# Patient Record
Sex: Female | Born: 1975 | Race: White | Hispanic: No | Marital: Married | Smoking: Never smoker
Health system: Southern US, Community
[De-identification: ages and names within clinical notes are randomized; demographics above are authoritative.]

## PROBLEM LIST (undated history)

## (undated) DIAGNOSIS — J45909 Unspecified asthma, uncomplicated: Secondary | ICD-10-CM

## (undated) DIAGNOSIS — F32A Depression, unspecified: Secondary | ICD-10-CM

## (undated) DIAGNOSIS — F329 Major depressive disorder, single episode, unspecified: Secondary | ICD-10-CM

## (undated) DIAGNOSIS — E039 Hypothyroidism, unspecified: Secondary | ICD-10-CM

---
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
## 2015-04-02 LAB — BASIC METABOLIC PANEL
BUN: 9 (ref 4–21)
CREATININE: 0.7 (ref 0.5–1.1)
Glucose: 128
POTASSIUM: 3.5 (ref 3.4–5.3)
Sodium: 137 (ref 137–147)

## 2015-04-02 LAB — CBC AND DIFFERENTIAL
HEMATOCRIT: 38 (ref 36–46)
HEMOGLOBIN: 12.8 (ref 12.0–16.0)
Platelets: 223 (ref 150–399)
WBC: 5.7

## 2015-04-02 LAB — HEPATIC FUNCTION PANEL
ALT: 18 (ref 7–35)
AST: 25 (ref 13–35)
Alkaline Phosphatase: 41 (ref 25–125)
Bilirubin, Total: 0.4

## 2015-10-31 LAB — TSH: TSH: 1.5 (ref <=5.90)

## 2015-11-02 LAB — HM MAMMOGRAPHY

## 2015-11-07 LAB — HM PAP SMEAR: HM PAP: NEGATIVE

## 2017-01-30 ENCOUNTER — Ambulatory Visit: Payer: Self-pay | Admitting: Family Medicine

## 2017-02-26 ENCOUNTER — Ambulatory Visit (INDEPENDENT_AMBULATORY_CARE_PROVIDER_SITE_OTHER): Admitting: Physician Assistant

## 2017-02-26 ENCOUNTER — Encounter: Payer: Self-pay | Admitting: Physician Assistant

## 2017-02-26 VITALS — BP 112/68 | HR 71 | Temp 98.3°F | Ht 63.0 in | Wt 122.0 lb

## 2017-02-26 DIAGNOSIS — E039 Hypothyroidism, unspecified: Secondary | ICD-10-CM

## 2017-02-26 DIAGNOSIS — Z8659 Personal history of other mental and behavioral disorders: Secondary | ICD-10-CM

## 2017-02-26 DIAGNOSIS — F329 Major depressive disorder, single episode, unspecified: Secondary | ICD-10-CM

## 2017-02-26 DIAGNOSIS — Z130 Encounter for screening for diseases of the blood and blood-forming organs and certain disorders involving the immune mechanism: Secondary | ICD-10-CM

## 2017-02-26 DIAGNOSIS — Z23 Encounter for immunization: Secondary | ICD-10-CM

## 2017-02-26 DIAGNOSIS — J45990 Exercise induced bronchospasm: Secondary | ICD-10-CM | POA: Insufficient documentation

## 2017-02-26 DIAGNOSIS — Z131 Encounter for screening for diabetes mellitus: Secondary | ICD-10-CM

## 2017-02-26 DIAGNOSIS — Z1239 Encounter for other screening for malignant neoplasm of breast: Secondary | ICD-10-CM

## 2017-02-26 DIAGNOSIS — F32A Depression, unspecified: Secondary | ICD-10-CM

## 2017-02-26 DIAGNOSIS — Z1231 Encounter for screening mammogram for malignant neoplasm of breast: Secondary | ICD-10-CM

## 2017-02-26 DIAGNOSIS — Z3041 Encounter for surveillance of contraceptive pills: Secondary | ICD-10-CM

## 2017-02-26 DIAGNOSIS — Z13220 Encounter for screening for lipoid disorders: Secondary | ICD-10-CM

## 2017-02-26 MED ORDER — DROSPIRENONE-ETHINYL ESTRADIOL 3-0.02 MG PO TABS: 1.0000 | ORAL_TABLET | Freq: Every day | ORAL | 4 refills | Status: DC

## 2017-02-26 MED ORDER — ALBUTEROL SULFATE HFA 108 (90 BASE) MCG/ACT IN AERS: 2.0000 | INHALATION_SPRAY | RESPIRATORY_TRACT | 3 refills | Status: DC | PRN

## 2017-02-26 MED ORDER — LEVOTHYROXINE SODIUM 25 MCG PO TABS: 12.5000 ug | ORAL_TABLET | Freq: Every day | ORAL | 0 refills | Status: DC

## 2017-02-26 MED ORDER — SERTRALINE HCL 100 MG PO TABS: 150.0000 mg | ORAL_TABLET | Freq: Every day | ORAL | 1 refills | Status: DC

## 2017-02-26 NOTE — Progress Notes (Signed)
Patient: Brianna SchwalbeJessica Ozment Female    DOB: 04/20/1975   41 y.o.   MRN: [id number] Visit Date: 02/26/2017  Today's Provider: Trey SailorsAdriana M Pollak, PA-C   Chief Complaint  Patient presents with  . Establish Care   Subjective:    Brianna SchwalbeJessica Casagrande is a 41 y/o woman presenting today to establish care. She has recently moved to the area due to her husband's job in the [employer]. 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  She reports last Tdap was with her youngest child, 2 years ago. She would like a flu shot today.   She has a history of hypothyroidism. She is on 12.5 mcg of Synthroid. Denies symptoms.  She has a history of anorexia. Had a relapse about two years ago and saw a specialist. Does feel her eating habits have improved and that she is not currently undergoing episode.  She uses 150 mg QD zoloft for depression, has been on this since age 41. Previously on Wellbutrin but she is no longer taking this any more.  She is using Yaz for birth control. She used to have menorrhagia prior to this. Now, she reports amenorrhea. Pregnancy test negative at gynecologist visit. She does take the placebo pills but does not experience withdrawal bleeding.  Reports episode of transient red, raised rash yesterday on her right leg. Rash was itchy, has improved some, no longer itching, just red.  Thyroid Problem  Presents for follow-up visit. Patient reports no anxiety, cold intolerance, constipation, depressed mood, diaphoresis, diarrhea, dry skin, fatigue, hair loss, heat intolerance, hoarse voice, leg swelling, menstrual problem, nail problem, palpitations, tremors, visual change, weight gain or weight loss. The symptoms have been stable.   Last mammogram normal      No Known Allergies   Current Outpatient Medications:  .  albuterol (PROVENTIL HFA;VENTOLIN HFA) 108 (90 Base) MCG/ACT inhaler, Inhale 2 puffs into the lungs  every 4 (four) hours as needed for wheezing or shortness of breath., Disp: 6.7 g, Rfl: 3 .  drospirenone-ethinyl estradiol (YAZ,GIANVI,LORYNA) 3-0.02 MG tablet, Take 1 tablet by mouth daily., Disp: 3 Package, Rfl: 4 .  IRON PO, Take by mouth., Disp: , Rfl:  .  levothyroxine (SYNTHROID, LEVOTHROID) 25 MCG tablet, Take 0.5 tablets (12.5 mcg total) by mouth daily before breakfast., Disp: 45 tablet, Rfl: 0 .  sertraline (ZOLOFT) 100 MG tablet, Take 1.5 tablets (150 mg total) by mouth daily., Disp: 135 tablet, Rfl: 1  Review of Systems  Constitutional: Negative.  Negative for diaphoresis, fatigue, weight gain and weight loss.  HENT: Positive for congestion. Negative for dental problem, drooling, ear discharge, ear pain, facial swelling, hearing loss, hoarse voice, mouth sores, nosebleeds, postnasal drip, rhinorrhea, sinus pressure, sinus pain, sneezing, sore throat, tinnitus, trouble swallowing and voice change.   Eyes: Negative.   Respiratory: Negative.   Cardiovascular: Negative.  Negative for palpitations.  Gastrointestinal: Negative.  Negative for constipation and diarrhea.  Endocrine: Negative.  Negative for cold intolerance and heat intolerance.  Genitourinary: Negative.  Negative for menstrual problem.  Musculoskeletal: Negative.   Skin: Negative.   Allergic/Immunologic: Negative.   Neurological: Negative.  Negative for tremors.  Hematological: Negative.   Psychiatric/Behavioral: Negative.  The patient is not nervous/anxious.     Social History   Tobacco Use  . Smoking status: Never Smoker  . Smokeless tobacco: Never Used  Substance Use Topics  . Alcohol use: Yes    Frequency: Never  Comment: Rarely    Objective:   BP 112/68 (BP Location: Right Arm, Patient Position: Sitting, Cuff Size: Normal)   Pulse 71   Temp 98.3 F (36.8 C) (Oral)   Ht 5\' 3"  (1.6 m)   Wt 122 lb (55.3 kg)   LMP 10/27/2016   SpO2 97%   BMI 21.61 kg/m  Vitals:   02/26/17 1518  BP: 112/68  Pulse:  71  Temp: 98.3 F (36.8 C)  TempSrc: Oral  SpO2: 97%  Weight: 122 lb (55.3 kg)  Height: 5\' 3"  (1.6 m)     Physical Exam  Constitutional: She is oriented to person, place, and time. She appears well-developed and well-nourished.  HENT:  Right Ear: External ear normal.  Left Ear: External ear normal.  Mouth/Throat: Oropharynx is clear and moist. No oropharyngeal exudate.  Eyes: Conjunctivae are normal.  Neck: Neck supple.  Cardiovascular: Normal rate and regular rhythm.  Pulmonary/Chest: Effort normal and breath sounds normal.  Abdominal: Soft. Bowel sounds are normal.  Lymphadenopathy:    She has no cervical adenopathy.  Neurological: She is alert and oriented to person, place, and time.  Skin: Skin is warm and dry.  Small area of erythema on right lateral knee. No induration, fluctuance, bleeding.  Psychiatric: She has a normal mood and affect. Her behavior is normal.        Assessment & Plan:     1. Hypothyroidism, unspecified type  Will check labs as below, adjust synthroid as necessary.  - TSH - T3, free - T4, free - levothyroxine (SYNTHROID, LEVOTHROID) 25 MCG tablet; Take 0.5 tablets (12.5 mcg total) by mouth daily before breakfast.  Dispense: 45 tablet; Refill: 0  2. Depression, unspecified depression type  Stable on zoloft. Will continue this. She is no longer taking her Wellbutrin. Discussed that this medication has the potential to cause seizures in people with history of or current eating disorders. Discussed if she should have worsening of depression, this may not be the medication to restart.  - sertraline (ZOLOFT) 100 MG tablet; Take 1.5 tablets (150 mg total) by mouth daily.  Dispense: 135 tablet; Refill: 1  3. History of anorexia nervosa  Currently in remission. She reports having talked to therapist at one point, feels she does not need one right now.   4. Breast cancer screening  Had mammogram last year which was normal. Would like to schedule  every two years. Will order next year.  5. Diabetes mellitus screening  - Comprehensive Metabolic Panel (CMET)  6. Screening cholesterol level  - Lipid Profile  7. Screening for deficiency anemia  - CBC with Differential  8. Encounter for surveillance of contraceptive pills  Experiencing some amenorrhea. She is at a stable weight and denies restricting. Do think this is 2/2 Yaz.  - drospirenone-ethinyl estradiol (YAZ,GIANVI,LORYNA) 3-0.02 MG tablet; Take 1 tablet by mouth daily.  Dispense: 3 Package; Refill: 4  9. Exercise-induced asthma  - albuterol (PROVENTIL HFA;VENTOLIN HFA) 108 (90 Base) MCG/ACT inhaler; Inhale 2 puffs into the lungs every 4 (four) hours as needed for wheezing or shortness of breath.  Dispense: 6.7 g; Refill: 3  Return in about 4 months (around 06/27/2017) for Hypothyroid, .  The entirety of the information documented in the History of Present Illness, Review of Systems and Physical Exam were personally obtained by me. Portions of this information were initially documented by Kavin LeechLaura Walsh, CMA and reviewed by me for thoroughness and accuracy.        Trey SailorsAdriana M Pollak, PA-C  [facility]

## 2017-02-26 NOTE — Patient Instructions (Signed)

## 2017-02-27 NOTE — Addendum Note (Signed)
Addended by: Paschal DoppWALSH, Kesha Hurrell E on: 02/27/2017 08:37 AM   Modules accepted: Orders

## 2017-03-12 ENCOUNTER — Other Ambulatory Visit: Payer: Self-pay | Admitting: Physician Assistant

## 2017-03-13 LAB — COMPREHENSIVE METABOLIC PANEL
ALT: 15 IU/L (ref 0–32)
AST: 17 IU/L (ref 0–40)
Albumin/Globulin Ratio: 1.7 (ref 1.2–2.2)
Albumin: 4 g/dL (ref 3.5–5.5)
Alkaline Phosphatase: 53 IU/L (ref 39–117)
BUN/Creatinine Ratio: 14 (ref 9–23)
BUN: 12 mg/dL (ref 6–24)
Bilirubin Total: <0.2 mg/dL (ref 0.0–1.2)
CO2: 22 mmol/L (ref 20–29)
Calcium: 9.2 mg/dL (ref 8.7–10.2)
Chloride: 103 mmol/L (ref 96–106)
Creatinine, Ser: 0.86 mg/dL (ref 0.57–1.00)
GFR calc Af Amer: 97 mL/min/{1.73_m2} (ref >59)
GFR calc non Af Amer: 84 mL/min/{1.73_m2} (ref >59)
Globulin, Total: 2.4 g/dL (ref 1.5–4.5)
Glucose: 97 mg/dL (ref 65–99)
Potassium: 5 mmol/L (ref 3.5–5.2)
Sodium: 141 mmol/L (ref 134–144)
Total Protein: 6.4 g/dL (ref 6.0–8.5)

## 2017-03-13 LAB — CBC WITH DIFFERENTIAL/PLATELET
Basophils Absolute: 0 10*3/uL (ref 0.0–0.2)
Basos: 1 %
EOS (ABSOLUTE): 0.2 10*3/uL (ref 0.0–0.4)
Eos: 3 %
Hematocrit: 40.3 % (ref 34.0–46.6)
Hemoglobin: 13.3 g/dL (ref 11.1–15.9)
Immature Grans (Abs): 0 10*3/uL (ref 0.0–0.1)
Immature Granulocytes: 0 %
Lymphocytes Absolute: 1.9 10*3/uL (ref 0.7–3.1)
Lymphs: 34 %
MCH: 29.6 pg (ref 26.6–33.0)
MCHC: 33 g/dL (ref 31.5–35.7)
MCV: 90 fL (ref 79–97)
Monocytes Absolute: 0.5 10*3/uL (ref 0.1–0.9)
Monocytes: 8 %
Neutrophils Absolute: 3.1 10*3/uL (ref 1.4–7.0)
Neutrophils: 54 %
Platelets: 312 10*3/uL (ref 150–379)
RBC: 4.49 x10E6/uL (ref 3.77–5.28)
RDW: 13.1 % (ref 12.3–15.4)
WBC: 5.6 10*3/uL (ref 3.4–10.8)

## 2017-03-13 LAB — LIPID PANEL
Chol/HDL Ratio: 2.9 ratio (ref 0.0–4.4)
Cholesterol, Total: 195 mg/dL (ref 100–199)
HDL: 68 mg/dL (ref >39)
LDL Calculated: 105 mg/dL — ABNORMAL HIGH (ref 0–99)
Triglycerides: 108 mg/dL (ref 0–149)
VLDL Cholesterol Cal: 22 mg/dL (ref 5–40)

## 2017-03-13 LAB — T4, FREE: Free T4: 0.91 ng/dL (ref 0.82–1.77)

## 2017-03-13 LAB — TSH: TSH: 1.59 u[IU]/mL (ref 0.450–4.500)

## 2017-03-13 LAB — T3, FREE: T3, Free: 2.9 pg/mL (ref 2.0–4.4)

## 2017-05-25 ENCOUNTER — Ambulatory Visit: Admitting: Physician Assistant

## 2017-05-26 ENCOUNTER — Encounter: Payer: Self-pay | Admitting: Physician Assistant

## 2017-05-26 ENCOUNTER — Ambulatory Visit (INDEPENDENT_AMBULATORY_CARE_PROVIDER_SITE_OTHER): Admitting: Physician Assistant

## 2017-05-26 VITALS — BP 118/74 | HR 62 | Temp 98.3°F | Resp 16 | Wt 119.0 lb

## 2017-05-26 DIAGNOSIS — G8929 Other chronic pain: Secondary | ICD-10-CM

## 2017-05-26 DIAGNOSIS — M545 Low back pain: Secondary | ICD-10-CM

## 2017-05-26 NOTE — Patient Instructions (Signed)

## 2017-05-26 NOTE — Progress Notes (Signed)
Patient: Brianna Brown Female    DOB: 1975-04-07   42 y.o.   MRN: [id number] Visit Date: 05/27/2017  Today's Provider: Trey Sailors, PA-C   Chief Complaint  Patient presents with  . Back Pain    On and off for about three years.    Subjective:    Brianna Brown is a 42 y/o woman presenting today for back pain that has been occurring for three years. 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 She reports pain in her low back for three years that travels into her thigh and also notes some numbness in her left foot as if a nerve is catching. She had an xray several years ago which was normal. The pain is increased during her dance routines and when bending over with her children. She takes NSAIDs sparingly which does help her pain. She has not had any weakness.   Back Pain  This is a chronic problem. The current episode started more than 1 year ago. The problem has been gradually worsening since onset. The pain is present in the lumbar spine. The pain radiates to the right thigh and left foot. The symptoms are aggravated by twisting, position and bending. Stiffness is present in the morning. Pertinent negatives include no abdominal pain, bladder incontinence, bowel incontinence, chest pain, dysuria, fever, headaches, leg pain, numbness, paresis, paresthesias, pelvic pain, perianal numbness, tingling, weakness or weight loss. She has tried analgesics and heat for the symptoms.    No Known Allergies   Current Outpatient Medications:  .  albuterol (PROVENTIL HFA;VENTOLIN HFA) 108 (90 Base) MCG/ACT inhaler, Inhale 2 puffs into the lungs every 4 (four) hours as needed for wheezing or shortness of breath., Disp: 6.7 g, Rfl: 3 .  IRON PO, Take by mouth., Disp: , Rfl:  .  levothyroxine (SYNTHROID, LEVOTHROID) 25 MCG tablet, Take 0.5 tablets (12.5 mcg total) by mouth daily before breakfast., Disp: 45 tablet, Rfl: 0 .  sertraline (ZOLOFT) 100 MG tablet, Take 1.5 tablets (150 mg  total) by mouth daily., Disp: 135 tablet, Rfl: 1 .  drospirenone-ethinyl estradiol (YAZ,GIANVI,LORYNA) 3-0.02 MG tablet, Take 1 tablet by mouth daily. (Patient not taking: Reported on 05/26/2017), Disp: 3 Package, Rfl: 4  Review of Systems  Constitutional: Negative for activity change, appetite change, chills, diaphoresis, fatigue, fever, unexpected weight change and weight loss.  Cardiovascular: Negative for chest pain.  Gastrointestinal: Negative for abdominal pain and bowel incontinence.  Genitourinary: Negative for bladder incontinence, dysuria and pelvic pain.  Musculoskeletal: Positive for back pain. Negative for arthralgias, gait problem, joint swelling, myalgias, neck pain and neck stiffness.  Neurological: Negative for dizziness, tingling, weakness, light-headedness, numbness, headaches and paresthesias.    Social History   Tobacco Use  . Smoking status: Never Smoker  . Smokeless tobacco: Never Used  Substance Use Topics  . Alcohol use: Yes    Frequency: Never    Comment: Rarely    Objective:   BP 118/74 (BP Location: Right Arm, Patient Position: Sitting, Cuff Size: Normal)   Pulse 62   Temp 98.3 F (36.8 C) (Oral)   Resp 16   Wt 119 lb (54 kg)   LMP 05/19/2017   BMI 21.08 kg/m  Vitals:   05/26/17 0940  BP: 118/74  Pulse: 62  Resp: 16  Temp: 98.3 F (36.8 C)  TempSrc: Oral  Weight: 119 lb (54 kg)     Physical Exam  Constitutional: She is oriented to person, place, and time.  She appears well-developed and well-nourished.  Cardiovascular: Normal rate.  Pulmonary/Chest: Effort normal.  Musculoskeletal: Normal range of motion. She exhibits no edema, tenderness or deformity.       Lumbar back: Normal.  Neurological: She is alert and oriented to person, place, and time. She has normal strength. No sensory deficit. She exhibits normal muscle tone. Gait normal.  Reflex Scores:      Patellar reflexes are 2+ on the right side and 2+ on the left side.      Achilles  reflexes are 2+ on the right side and 2+ on the left side. Skin: Skin is warm and dry.  Psychiatric: She has a normal mood and affect. Her behavior is normal.        Assessment & Plan:     1. 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   - Ambulatory referral to Orthopedics  Return if symptoms worsen or fail to improve.  The entirety of the information documented in the History of Present Illness, Review of Systems and Physical Exam were personally obtained by me. Portions of this information were initially documented by Kavin LeechLaura Walsh, CMA and reviewed by me for thoroughness and accuracy.        Trey SailorsAdriana M Pollak, PA-C  [facility]

## 2017-06-07 ENCOUNTER — Other Ambulatory Visit: Payer: Self-pay | Admitting: Physician Assistant

## 2017-06-07 DIAGNOSIS — E039 Hypothyroidism, unspecified: Secondary | ICD-10-CM

## 2017-06-30 ENCOUNTER — Ambulatory Visit (INDEPENDENT_AMBULATORY_CARE_PROVIDER_SITE_OTHER): Admitting: Physician Assistant

## 2017-06-30 ENCOUNTER — Encounter: Payer: Self-pay | Admitting: Physician Assistant

## 2017-06-30 VITALS — BP 102/60 | HR 74 | Temp 98.2°F | Resp 16 | Ht 63.0 in | Wt 120.0 lb

## 2017-06-30 DIAGNOSIS — F329 Major depressive disorder, single episode, unspecified: Secondary | ICD-10-CM

## 2017-06-30 DIAGNOSIS — Z8659 Personal history of other mental and behavioral disorders: Secondary | ICD-10-CM | POA: Diagnosis not present

## 2017-06-30 DIAGNOSIS — J069 Acute upper respiratory infection, unspecified: Secondary | ICD-10-CM | POA: Diagnosis not present

## 2017-06-30 DIAGNOSIS — F32A Depression, unspecified: Secondary | ICD-10-CM

## 2017-06-30 NOTE — Progress Notes (Signed)
Patient: Brianna Brown Female    DOB: [birth year]   42 y.o.   MRN: [id number] Visit Date: 07/01/2017  Today's Provider: Trey Sailors, PA-C   Chief Complaint  Patient presents with  . Depression  . Hypothyroidism  . URI   Subjective:    HPI  Patient comes in today for a follow up. Patient was last seen in the office 4 months ago, and was started on Sertraline 100mg  daily. Patient reports that she has tolerated the medication well. She does not desire increase. She is not currently seeing therapist at this point and doesn't feel she has the need just yet.  Patient also mentions that she has had URI symptoms X 3 days. She reports that she has cough, runny nose, and excessive fatigue that does not seem to be getting better. She has taken Nyquil with no relief.   In the interim, she has seen orthopedics for back pain/sciatica. Currently on prednisone. Reassessing in 6 weeks, may get lumbar MRI or steroid injections.       No Known Allergies   Current Outpatient Medications:  .  albuterol (PROVENTIL HFA;VENTOLIN HFA) 108 (90 Base) MCG/ACT inhaler, Inhale 2 puffs into the lungs every 4 (four) hours as needed for wheezing or shortness of breath., Disp: 6.7 g, Rfl: 3 .  IRON PO, Take by mouth., Disp: , Rfl:  .  levothyroxine (SYNTHROID, LEVOTHROID) 25 MCG tablet, TAKE 1/2 TABLET(12.5 MCG) BY MOUTH DAILY BEFORE BREAKFAST, Disp: 45 tablet, Rfl: 0 .  sertraline (ZOLOFT) 100 MG tablet, Take 1.5 tablets (150 mg total) by mouth daily., Disp: 135 tablet, Rfl: 1 .  drospirenone-ethinyl estradiol (YAZ,GIANVI,LORYNA) 3-0.02 MG tablet, Take 1 tablet by mouth daily. (Patient not taking: Reported on 05/26/2017), Disp: 3 Package, Rfl: 4  Review of Systems  Constitutional: Positive for fatigue. Negative for activity change, appetite change, chills, diaphoresis, fever and unexpected weight change.  HENT: Positive for congestion, postnasal drip, rhinorrhea and sneezing.   Eyes: Negative.     Respiratory: Positive for cough.   Allergic/Immunologic: Positive for environmental allergies.  Neurological: Positive for headaches.    Social History   Tobacco Use  . Smoking status: Never Smoker  . Smokeless tobacco: Never Used  Substance Use Topics  . Alcohol use: Yes    Frequency: Never    Comment: Rarely    Objective:   BP 102/60 (BP Location: Right Arm, Patient Position: Sitting, Cuff Size: Normal)   Pulse 74   Temp 98.2 F (36.8 C)   Resp 16   Ht 5\' 3"  (1.6 m)   Wt 120 lb (54.4 kg)   SpO2 99%   BMI 21.26 kg/m  Vitals:   06/30/17 0831  BP: 102/60  Pulse: 74  Resp: 16  Temp: 98.2 F (36.8 C)  SpO2: 99%  Weight: 120 lb (54.4 kg)  Height: 5\' 3"  (1.6 m)     Physical Exam  Constitutional: She is oriented to person, place, and time. She appears well-developed and well-nourished.  Cardiovascular: Normal rate and regular rhythm.  Pulmonary/Chest: Effort normal and breath sounds normal.  Abdominal: Soft. Bowel sounds are normal.  Neurological: She is alert and oriented to person, place, and time.  Skin: Skin is warm and dry.  Psychiatric: She has a normal mood and affect. Her behavior is normal.        Assessment & Plan:     1. Depression, unspecified depression type  Stable on Zoloft 100 mg daily, continue.  2. History  of anorexia nervosa  Weight stable between recent visits.  3. Viral URI  OTC medications counseled.  No follow-ups on file.  The entirety of the information documented in the History of Present Illness, Review of Systems and Physical Exam were personally obtained by me. Portions of this information were initially documented by Anson Oregonachelle Presley, CMA and reviewed by me for thoroughness and accuracy.             Trey SailorsAdriana M Kinlie Janice, PA-C  [facility]

## 2017-07-01 NOTE — Patient Instructions (Signed)
Upper Respiratory Infection, Adult Most upper respiratory infections (URIs) are caused by a virus. A URI affects the nose, throat, and upper air passages. The most common type of URI is often called "the common cold." Follow these instructions at home:  Take medicines only as told by your doctor.  Gargle warm saltwater or take cough drops to comfort your throat as told by your doctor.  Use a warm mist humidifier or inhale steam from a shower to increase air moisture. This may make it easier to breathe.  Drink enough fluid to keep your pee (urine) clear or pale yellow.  Eat soups and other clear broths.  Have a healthy diet.  Rest as needed.  Go back to work when your fever is gone or your doctor says it is okay. ? You may need to stay home longer to avoid giving your URI to others. ? You can also wear a face mask and wash your hands often to prevent spread of the virus.  Use your inhaler more if you have asthma.  Do not use any tobacco products, including cigarettes, chewing tobacco, or electronic cigarettes. If you need help quitting, ask your doctor. Contact a doctor if:  You are getting worse, not better.  Your symptoms are not helped by medicine.  You have chills.  You are getting more short of breath.  You have brown or red mucus.  You have yellow or brown discharge from your nose.  You have pain in your face, especially when you bend forward.  You have a fever.  You have puffy (swollen) neck glands.  You have pain while swallowing.  You have white areas in the back of your throat. Get help right away if:  You have very bad or constant: ? Headache. ? Ear pain. ? Pain in your forehead, behind your eyes, and over your cheekbones (sinus pain). ? Chest pain.  You have long-lasting (chronic) lung disease and any of the following: ? Wheezing. ? Long-lasting cough. ? Coughing up blood. ? A change in your usual mucus.  You have a stiff neck.  You have  changes in your: ? Vision. ? Hearing. ? Thinking. ? Mood. This information is not intended to replace advice given to you by your health care provider. Make sure you discuss any questions you have with your health care provider. Document Released: 08/13/2007 Document Revised: 10/28/2015 Document Reviewed: 06/01/2013 Elsevier Interactive Patient Education  2018 Elsevier Inc.  

## 2017-08-26 ENCOUNTER — Other Ambulatory Visit: Payer: Self-pay | Admitting: Physical Medicine and Rehabilitation

## 2017-08-26 DIAGNOSIS — M5416 Radiculopathy, lumbar region: Secondary | ICD-10-CM

## 2017-09-06 ENCOUNTER — Other Ambulatory Visit: Payer: Self-pay | Admitting: Physician Assistant

## 2017-09-06 DIAGNOSIS — E039 Hypothyroidism, unspecified: Secondary | ICD-10-CM

## 2017-09-09 ENCOUNTER — Ambulatory Visit
Admission: RE | Admit: 2017-09-09 | Discharge: 2017-09-09 | Disposition: A | Discharge status: Home / Self Care | Source: Ambulatory Visit | Attending: Physical Medicine and Rehabilitation | Admitting: Physical Medicine and Rehabilitation

## 2017-09-09 DIAGNOSIS — M5416 Radiculopathy, lumbar region: Secondary | ICD-10-CM | POA: Diagnosis not present

## 2017-09-09 DIAGNOSIS — M5126 Other intervertebral disc displacement, lumbar region: Secondary | ICD-10-CM | POA: Insufficient documentation

## 2017-11-03 ENCOUNTER — Ambulatory Visit: Payer: Self-pay | Admitting: Physician Assistant

## 2017-11-06 ENCOUNTER — Ambulatory Visit: Payer: Self-pay | Admitting: Physician Assistant

## 2017-11-16 ENCOUNTER — Other Ambulatory Visit: Payer: Self-pay | Admitting: Physician Assistant

## 2017-11-16 DIAGNOSIS — F329 Major depressive disorder, single episode, unspecified: Secondary | ICD-10-CM

## 2017-11-16 DIAGNOSIS — F32A Depression, unspecified: Secondary | ICD-10-CM

## 2017-12-08 ENCOUNTER — Ambulatory Visit: Payer: Self-pay | Admitting: Physician Assistant

## 2017-12-13 ENCOUNTER — Other Ambulatory Visit: Payer: Self-pay | Admitting: Physician Assistant

## 2017-12-13 DIAGNOSIS — E039 Hypothyroidism, unspecified: Secondary | ICD-10-CM

## 2017-12-16 NOTE — Telephone Encounter (Signed)
[facility] faxed refill request for the following medications:  levothyroxine (SYNTHROID, LEVOTHROID) 25 MCG tablet  Last refill: 09/07/2017  Second attempt  Please advise.

## 2018-03-15 ENCOUNTER — Other Ambulatory Visit: Payer: Self-pay | Admitting: Physician Assistant

## 2018-03-15 DIAGNOSIS — E039 Hypothyroidism, unspecified: Secondary | ICD-10-CM

## 2018-04-05 ENCOUNTER — Encounter: Payer: Self-pay | Admitting: Physician Assistant

## 2018-04-05 ENCOUNTER — Ambulatory Visit (INDEPENDENT_AMBULATORY_CARE_PROVIDER_SITE_OTHER): Admitting: Physician Assistant

## 2018-04-05 VITALS — BP 95/64 | HR 76 | Temp 98.2°F | Resp 16 | Wt 117.0 lb

## 2018-04-05 DIAGNOSIS — R05 Cough: Secondary | ICD-10-CM

## 2018-04-05 DIAGNOSIS — J029 Acute pharyngitis, unspecified: Secondary | ICD-10-CM

## 2018-04-05 DIAGNOSIS — R509 Fever, unspecified: Secondary | ICD-10-CM | POA: Diagnosis not present

## 2018-04-05 DIAGNOSIS — R059 Cough, unspecified: Secondary | ICD-10-CM

## 2018-04-05 LAB — POCT INFLUENZA A/B
Influenza A, POC: NEGATIVE
Influenza B, POC: NEGATIVE

## 2018-04-05 LAB — POCT RAPID STREP A (OFFICE): Rapid Strep A Screen: NEGATIVE

## 2018-04-05 MED ORDER — BENZONATATE 100 MG PO CAPS: 100.0000 mg | ORAL_CAPSULE | Freq: Three times a day (TID) | ORAL | 0 refills | Status: AC | PRN

## 2018-04-05 NOTE — Patient Instructions (Signed)
Viral Respiratory Infection  A viral respiratory infection is an illness that affects parts of the body that are used for breathing. These include the lungs, nose, and throat. It is caused by a germ called a virus.  Some examples of this kind of infection are:  · A cold.  · The flu (influenza).  · A respiratory syncytial virus (RSV) infection.  A person who gets this illness may have the following symptoms:  · A stuffy or runny nose.  · Yellow or green fluid in the nose.  · A cough.  · Sneezing.  · Tiredness (fatigue).  · Achy muscles.  · A sore throat.  · Sweating or chills.  · A fever.  · A headache.  Follow these instructions at home:  Managing pain and congestion  · Take over-the-counter and prescription medicines only as told by your doctor.  · If you have a sore throat, gargle with salt water. Do this 3-4 times per day or as needed. To make a salt-water mixture, dissolve ½-1 tsp of salt in 1 cup of warm water. Make sure that all the salt dissolves.  · Use nose drops made from salt water. This helps with stuffiness (congestion). It also helps soften the skin around your nose.  · Drink enough fluid to keep your pee (urine) pale yellow.  General instructions    · Rest as much as possible.  · Do not drink alcohol.  · Do not use any products that have nicotine or tobacco, such as cigarettes and e-cigarettes. If you need help quitting, ask your doctor.  · Keep all follow-up visits as told by your doctor. This is important.  How is this prevented?    · Get a flu shot every year. Ask your doctor when you should get your flu shot.  · Do not let other people get your germs. If you are sick:  ? Stay home from work or school.  ? Wash your hands with soap and water often. Wash your hands after you cough or sneeze. If soap and water are not available, use hand sanitizer.  · Avoid contact with people who are sick during cold and flu season. This is in fall and winter.  Get help if:  · Your symptoms last for 10 days or  longer.  · Your symptoms get worse over time.  · You have a fever.  · You have very bad pain in your face or forehead.  · Parts of your jaw or neck become very swollen.  Get help right away if:  · You feel pain or pressure in your chest.  · You have shortness of breath.  · You faint or feel like you will faint.  · You keep throwing up (vomiting).  · You feel confused.  Summary  · A viral respiratory infection is an illness that affects parts of the body that are used for breathing.  · Examples of this illness include a cold, the flu, and respiratory syncytial virus (RSV) infection.  · The infection can cause a runny nose, cough, sneezing, sore throat, and fever.  · Follow what your doctor tells you about taking medicines, drinking lots of fluid, washing your hands, resting at home, and avoiding people who are sick.  This information is not intended to replace advice given to you by your health care provider. Make sure you discuss any questions you have with your health care provider.  Document Released: 02/07/2008 Document Revised: 04/06/2017 Document Reviewed: 04/06/2017  Elsevier   Interactive Patient Education © 2019 Elsevier Inc.

## 2018-04-05 NOTE — Progress Notes (Signed)
Patient: Brianna Brown Female    DOB: 1975/07/25   43 y.o.   MRN: [id number] Visit Date: 04/05/2018  Today's Provider: Trey Sailors, PA-C   Chief Complaint  Patient presents with  . URI   Subjective:     HPI Upper Respiratory Infection: Patient complains of symptoms of a URI. Symptoms include congestion, cough and fever. Onset of symptoms was 3 days ago, gradually worsening since that time. She also c/o sore throat for the past 1 day .  She is drinking plenty of fluids. Evaluation to date: none. Treatment to date: decongestants. 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    No Known Allergies   Current Outpatient Medications:  .  albuterol (PROVENTIL HFA;VENTOLIN HFA) 108 (90 Base) MCG/ACT inhaler, Inhale 2 puffs into the lungs every 4 (four) hours as needed for wheezing or shortness of breath., Disp: 6.7 g, Rfl: 3 .  gabapentin (NEURONTIN) 100 MG capsule, TAKE ONE CAPSULE BY MOUTH THREE TIMES DAILY, Disp: , Rfl:  .  levothyroxine (SYNTHROID, LEVOTHROID) 25 MCG tablet, TAKE 1/2 TABLET(12.5 MCG) BY MOUTH DAILY BEFORE BREAKFAST, Disp: 45 tablet, Rfl: 0 .  sertraline (ZOLOFT) 100 MG tablet, TAKE 1 1/2 TABLETS(150 MG) BY MOUTH DAILY, Disp: 135 tablet, Rfl: 1  Review of Systems  Constitutional: Positive for fever.  HENT: Positive for congestion, ear discharge, postnasal drip and sore throat.   Respiratory: Positive for cough.   Musculoskeletal: Positive for myalgias.    Social History   Tobacco Use  . Smoking status: Never Smoker  . Smokeless tobacco: Never Used  Substance Use Topics  . Alcohol use: Yes    Frequency: Never    Comment: Rarely       Objective:   BP 95/64 (BP Location: Right Arm, Patient Position: Sitting, Cuff Size: Normal)   Pulse 76   Temp 98.2 F (36.8 C) (Oral)   Resp 16   Wt 117 lb (53.1 kg)   SpO2 97%   BMI 20.73 kg/m  Vitals:   04/05/18 1354  BP: 95/64  Pulse: 76  Resp: 16  Temp:  98.2 F (36.8 C)  TempSrc: Oral  SpO2: 97%  Weight: 117 lb (53.1 kg)     Physical Exam Constitutional:      Appearance: She is well-developed.  HENT:     Right Ear: External ear normal.     Left Ear: External ear normal.     Mouth/Throat:     Mouth: Mucous membranes are moist.     Pharynx: Posterior oropharyngeal erythema present. No oropharyngeal exudate.  Eyes:     General:        Right eye: Discharge present.        Left eye: Discharge present. Neck:     Musculoskeletal: Neck supple.  Cardiovascular:     Rate and Rhythm: Normal rate and regular rhythm.  Pulmonary:     Effort: Pulmonary effort is normal. No respiratory distress.     Breath sounds: Normal breath sounds. No rales.  Lymphadenopathy:     Cervical: Cervical adenopathy present.  Skin:    General: Skin is warm and dry.  Neurological:     Mental Status: Mental status is at baseline.  Psychiatric:        Mood and Affect: Mood normal.        Behavior: Behavior normal.         Assessment & Plan    1. Fever, unspecified fever  cause  Rapid flu and strep negative. Counseled on sx tx including anti-inflammatories. Offered prednisone for severe sore throat but patient would just like cough medications.   - POCT Influenza A/B  2. Sore throat  - POCT rapid strep A  3. Cough  - benzonatate (TESSALON PERLES) 100 MG capsule; Take 1 capsule (100 mg total) by mouth 3 (three) times daily as needed for up to 7 days.  Dispense: 21 capsule; Refill: 0  The entirety of the information documented in the History of Present Illness, Review of Systems and Physical Exam were personally obtained by me. Portions of this information were initially documented by Rondel Baton, CMA and reviewed by me for thoroughness and accuracy.   No follow-ups on file.     Trey Sailors, PA-C  [facility]

## 2018-06-17 ENCOUNTER — Other Ambulatory Visit: Payer: Self-pay | Admitting: Physician Assistant

## 2018-06-17 DIAGNOSIS — E039 Hypothyroidism, unspecified: Secondary | ICD-10-CM

## 2018-08-12 ENCOUNTER — Other Ambulatory Visit: Payer: Self-pay | Admitting: Physician Assistant

## 2018-08-12 DIAGNOSIS — E039 Hypothyroidism, unspecified: Secondary | ICD-10-CM

## 2018-08-12 NOTE — Telephone Encounter (Signed)
Please review

## 2018-08-12 NOTE — Telephone Encounter (Signed)
Please schedule for virtual visit in the next few months, due for follow up.

## 2018-08-16 ENCOUNTER — Ambulatory Visit: Payer: Self-pay | Admitting: Physician Assistant

## 2018-08-17 ENCOUNTER — Ambulatory Visit (INDEPENDENT_AMBULATORY_CARE_PROVIDER_SITE_OTHER): Admitting: Physician Assistant

## 2018-08-17 ENCOUNTER — Encounter: Payer: Self-pay | Admitting: Physician Assistant

## 2018-08-17 VITALS — Ht 63.0 in | Wt 108.0 lb

## 2018-08-17 DIAGNOSIS — F329 Major depressive disorder, single episode, unspecified: Secondary | ICD-10-CM

## 2018-08-17 DIAGNOSIS — E039 Hypothyroidism, unspecified: Secondary | ICD-10-CM

## 2018-08-17 DIAGNOSIS — F32A Depression, unspecified: Secondary | ICD-10-CM

## 2018-08-17 NOTE — Progress Notes (Signed)
Patient: Brianna Brown Female    DOB: 07/04/1975   43 y.o.   MRN: [id number] Visit Date: 08/17/2018  Today's Provider: Trey SailorsAdriana M Axie Hayne, PA-C   Chief Complaint  Patient presents with  . Follow-up   Subjective:    Virtual Visit via Telephone Note  I connected with Brianna SchwalbeJessica Nesby on 08/17/18 at  3:00 PM EDT by telephone and verified that I am speaking with the correct person using two identifiers.   I discussed the limitations, risks, security and privacy concerns of performing an evaluation and management service by telephone and the availability of in person appointments. I also discussed with the patient that there may be a patient responsible charge related to this service. The patient expressed understanding and agreed to proceed.   Patient location: home [facility]  Persons involved in the visit: patient, provider   HPI  Hypothyroid, follow-up:  Currently taking synthroid 25 mcg daily. She has no issues with this.   TSH  Date Value Ref Range Status  03/12/2017 1.590 0.450 - 4.500 uIU/mL Final  10/31/2015 1.50 0.41 - 5.90 Final   Wt Readings from Last 3 Encounters:  08/17/18 108 lb (49 kg)  04/05/18 117 lb (53.1 kg)  06/30/17 120 lb (54.4 kg)    She was last seen for hypothyroid 6 months ago.  Management since that visit includes . She reports excellent compliance with treatment. She is not having side effects.  She is exercising. She is experiencing none She denies change in energy level, diarrhea, heat / cold intolerance, nervousness, palpitations and weight changes Weight trend: stable  Depression:   She reports she had decreased her zoloft to 100 mg daily but felt she needed the 150 mg daily and has started taking that again. She is not currently in touch with counselor. History of disordered eating with some weight loss noted over the past year.    ------------------------------------------------------------------------  No Known Allergies   Current Outpatient Medications:  .  gabapentin (NEURONTIN) 100 MG capsule, TAKE ONE CAPSULE BY MOUTH THREE TIMES DAILY, Disp: , Rfl:  .  levothyroxine (SYNTHROID) 25 MCG tablet, TAKE 1/2 TABLET(12.5 MCG) BY MOUTH DAILY BEFORE BREAKFAST, Disp: 45 tablet, Rfl: 0 .  sertraline (ZOLOFT) 100 MG tablet, TAKE 1 1/2 TABLETS(150 MG) BY MOUTH DAILY, Disp: 135 tablet, Rfl: 1 .  albuterol (PROVENTIL HFA;VENTOLIN HFA) 108 (90 Base) MCG/ACT inhaler, Inhale 2 puffs into the lungs every 4 (four) hours as needed for wheezing or shortness of breath. (Patient not taking: Reported on 08/17/2018), Disp: 6.7 g, Rfl: 3  Review of Systems  Constitutional: Negative.   Respiratory: Negative.   Cardiovascular: Negative.   Endocrine: Negative.     Social History   Tobacco Use  . Smoking status: Never Smoker  . Smokeless tobacco: Never Used  Substance Use Topics  . Alcohol use: Yes    Frequency: Never    Comment: Rarely       Objective:   Ht 5\' 3"  (1.6 m)   Wt 108 lb (49 kg)   BMI 19.13 kg/m  Vitals:   08/17/18 1500  Weight: 108 lb (49 kg)  Height: 5\' 3"  (1.6 m)     Physical Exam    [facility]  PHQ-9 Total Score  6         Assessment & Plan    1. Hypothyroidism, unspecified type  - TSH  2. Depression, unspecified depression type  Will refer to counseling. Continue zoloft.   - Ambulatory referral to Chronic Care Management Services  The entirety of the information documented in the History of Present Illness, Review of Systems and Physical Exam were personally obtained by me. Portions of this information were initially documented by Lynford Humphrey, CMA and reviewed by me for thoroughness and accuracy.   F/u PRN     Trinna Post, PA-C  [facility]

## 2018-08-17 NOTE — Patient Instructions (Signed)
Hypothyroidism  Hypothyroidism is when the thyroid gland does not make enough of certain hormones (it is underactive). The thyroid gland is a small gland located in the lower front part of the neck, just in front of the windpipe (trachea). This gland makes hormones that help control how the body uses food for energy (metabolism) as well as how the heart and brain function. These hormones also play a role in keeping your bones strong. When the thyroid is underactive, it produces too little of the hormones thyroxine (T4) and triiodothyronine (T3). What are the causes? This condition may be caused by:  Hashimoto's disease. This is a disease in which the body's disease-fighting system (immune system) attacks the thyroid gland. This is the most common cause.  Viral infections.  Pregnancy.  Certain medicines.  Birth defects.  Past radiation treatments to the head or neck for cancer.  Past treatment with radioactive iodine.  Past exposure to radiation in the environment.  Past surgical removal of part or all of the thyroid.  Problems with a gland in the center of the brain (pituitary gland).  Lack of enough iodine in the diet. What increases the risk? You are more likely to develop this condition if:  You are female.  You have a family history of thyroid conditions.  You use a medicine called lithium.  You take medicines that affect the immune system (immunosuppressants). What are the signs or symptoms? Symptoms of this condition include:  Feeling as though you have no energy (lethargy).  Not being able to tolerate cold.  Weight gain that is not explained by a change in diet or exercise habits.  Lack of appetite.  Dry skin.  Coarse hair.  Menstrual irregularity.  Slowing of thought processes.  Constipation.  Sadness or depression. How is this diagnosed? This condition may be diagnosed based on:  Your symptoms, your medical history, and a physical exam.  Blood  tests. You may also have imaging tests, such as an ultrasound or MRI. How is this treated? This condition is treated with medicine that replaces the thyroid hormones that your body does not make. After you begin treatment, it may take several weeks for symptoms to go away. Follow these instructions at home:  Take over-the-counter and prescription medicines only as told by your health care provider.  If you start taking any new medicines, tell your health care provider.  Keep all follow-up visits as told by your health care provider. This is important. ? As your condition improves, your dosage of thyroid hormone medicine may change. ? You will need to have blood tests regularly so that your health care provider can monitor your condition. Contact a health care provider if:  Your symptoms do not get better with treatment.  You are taking thyroid replacement medicine and you: ? Sweat a lot. ? Have tremors. ? Feel anxious. ? Lose weight rapidly. ? Cannot tolerate heat. ? Have emotional swings. ? Have diarrhea. ? Feel weak. Get help right away if you have:  Chest pain.  An irregular heartbeat.  A rapid heartbeat.  Difficulty breathing. Summary  Hypothyroidism is when the thyroid gland does not make enough of certain hormones (it is underactive).  When the thyroid is underactive, it produces too little of the hormones thyroxine (T4) and triiodothyronine (T3).  The most common cause is Hashimoto's disease, a disease in which the body's disease-fighting system (immune system) attacks the thyroid gland. The condition can also be caused by viral infections, medicine, pregnancy, or past   radiation treatment to the head or neck.  Symptoms may include weight gain, dry skin, constipation, feeling as though you do not have energy, and not being able to tolerate cold.  This condition is treated with medicine to replace the thyroid hormones that your body does not make. This information  is not intended to replace advice given to you by your health care provider. Make sure you discuss any questions you have with your health care provider. Document Released: 02/24/2005 Document Revised: 02/04/2017 Document Reviewed: 02/04/2017 Elsevier Interactive Patient Education  2019 Elsevier Inc.  

## 2018-08-18 ENCOUNTER — Ambulatory Visit: Payer: Self-pay

## 2018-08-18 DIAGNOSIS — Z8659 Personal history of other mental and behavioral disorders: Secondary | ICD-10-CM

## 2018-08-18 DIAGNOSIS — F32A Depression, unspecified: Secondary | ICD-10-CM

## 2018-08-18 DIAGNOSIS — F329 Major depressive disorder, single episode, unspecified: Secondary | ICD-10-CM

## 2018-08-18 NOTE — Chronic Care Management (AMB) (Signed)
  Care Management   Note  08/18/2018 Name: Raechal Raben MRN: [id number] DOB: Jul 20, 1975  Karsen Fellows is a 42 year old female who sees Carles Collet, PA-C for primary care. Ms. Terrilee Croak asked the CCM team to consult the patient for LCSW services secondary to need for counseling. Patient has a history of but not limited to Exercise-induced asthma, hypothyroidism and anorexia nervosa. Referral was placed 08/17/2018 during office visit. Telephone outreach to patient today to introduce CCM services.  Ms. Empie was given information about Care Management services today including:  1. Case Management services include personalized support from designated clinical staff supervised by a physician, including individualized plan of care and coordination with other care providers 2. 24/7 contact phone numbers for assistance for urgent and routine care needs. 3. The patient may stop CCM services at any time (effective at the end of the month) by phone call to the office staff.   Patient agreed to services and verbal consent obtained.     Plan: Appointment with LCSW scheduled for 09/03/2018 at 10:00  [place] Skalicky E. Rollene Rotunda, RN, BSN Nurse Care Coordinator [facility] Care Management [phone]

## 2018-09-03 ENCOUNTER — Ambulatory Visit: Payer: Self-pay | Admitting: *Deleted

## 2018-09-03 DIAGNOSIS — F329 Major depressive disorder, single episode, unspecified: Secondary | ICD-10-CM

## 2018-09-03 DIAGNOSIS — Z8659 Personal history of other mental and behavioral disorders: Secondary | ICD-10-CM

## 2018-09-03 DIAGNOSIS — F32A Depression, unspecified: Secondary | ICD-10-CM

## 2018-09-04 ENCOUNTER — Encounter: Payer: Self-pay | Admitting: *Deleted

## 2018-09-04 NOTE — Chronic Care Management (AMB) (Signed)
Initial goal documentation Care Management    Clinical Social Work INITIAL Behavioral Health Screening Note  09/04/2018 Name: Brianna Brown MRN: [id number] DOB: 06/28/1975  Brianna SchwalbeJessica Brown is a 43 y.o. year old female who sees Trey Sailorsollak, Adriana M, [place]C for primary care. Osvaldo AngstAdriana Pollak, PA-C asked the CCM team to consult the patient for assistance with Mental Health Counseling and Resources.   Referred by: Osvaldo AngstAdriana Pollak, PA-C  Review of patient status, including review of consultants reports, relevant laboratory and other test results, and collaboration with appropriate care team members and the patient's provider was performed as part of comprehensive patient evaluation and provision of chronic care management services.   Patient and/or legal guardian verbally consented to meet with LCSW about presenting concerns.  OBJECTIVE:  Dep[facility] 09/04/2018 08/17/2018 02/27/2017  Decreased Interest 1 1 1   Down, Depressed, Hopeless 1 1 1   PHQ - 2 Score 2 2 2   Altered sleeping 1 1 0  Tired, decreased energy 1 1 3   Change in appetite 1 1 2   Feeling bad or failure about yourself  1 1 1   Trouble concentrating 0 0 0  Moving slowly or fidgety/restless 0 0 0  Suicidal thoughts 0 0 0  PHQ-9 Score 6 6 8   Difficult doing work/chores Somewhat difficult Somewhat difficult Not difficult at all     No flowsheet data found.   Family history of psychiatric issues:  Per patient, her father suffered from clinical depression. Passed away 5 years ago. Current and history of substance use:  None   Family/Social Information:  Housing Arrangement: Patient resides with her husband and their 3 children ages 1038,5,and 4      Family members/support persons in your life? Patient describes her husband as emotionally supportive as well as her siblings    Transportation to appointments provided by self  Financial concerns: none  Food Security: none  Access to Dental Care: yes  Medication  Concerns: none(if patient is experiencing medication concerns, please refer to pharmacy)  Services Currently in place:  Patient has been placed on psychotropic medication by PCP  Patient enjoys the arts and her children  Patient reported stressors: recent change in job responsibilities and limitations due to COVID-19   Goals Addressed            This Visit's Progress   . Patient Stated (pt-stated)       This social worker spoke to patient today by phone. Patient describes a history of clinical depression and anorexia stating that her symptoms have seemed to re-surface due to change of job responsibilities at work and the limitations of COVID. Patient also coping with struggles in her family.   Current Barriers:  Marland Kitchen. Mental Health Concerns   Clinical Social Work Clinical Goal(s):  Marland Kitchen. Over the next 90 days, patient will work with this Child psychotherapistsocial worker  to address needs related to management of depressive symptoms  Interventions: . Patient interviewed and appropriate assessments performed . Provided mental health counseling with regard to depressed mood (mental health diagnosis or concern) . Discussed plans with patient for ongoing care management follow up and provided patient with direct contact information for care management team . Advised patient to develop positive self care strategies including the development of a daily schedule to promote purpose and to focus on the positive aspects of her life  . Reinforced safety plan of talking to her husband or going to the emergency room with thoughts of self harm .   Patient Self Care Activities:  .  Self administers medications as prescribed . Attends all scheduled provider appointments . Performs ADL's independently . Performs IADL's independently  Initial goal documentation         Follow Up Plan: Appointment scheduled for SW follow up with client by phone on: 09/17/18 10:00am   [place], [place] Worker   [facility] [phone]

## 2018-09-04 NOTE — Patient Instructions (Signed)
Thank you allowing the Chronic Care Management Team to be a part of your care! It was a pleasure speaking with you today!  1. Please feel free to call this social worker with any questions or concerns regarding your mental health needs. 2. Please utilize desired self care activities and safety plan when needed  CCM (Chronic Care Management) Team   Trish Fountain RN, BSN Nurse Care Coordinator  [phone]  Ruben Reason PharmD  Clinical Pharmacist  [phone]   [place], LCSW Clinical Social Worker [phone]  Goals Addressed            This Visit's Progress   . "I want to manage my depression better" (pt-stated)       Current Barriers:  Marland Kitchen Mental Health Concerns   Clinical Social Work Clinical Goal(s):  Marland Kitchen Over the next 90 days, patient will work with this Education officer, museum  to address needs related to management of depressive symptoms  Interventions: . Patient interviewed and appropriate assessments performed . Provided mental health counseling with regard to depressed mood (mental health diagnosis or concern) . Discussed plans with patient for ongoing care management follow up and provided patient with direct contact information for care management team . Advised patient to develop positive self care strategies including the development of a daily schedule to promote purpose  and to focus on the positive aspects of her life  . Reinforced safety plan of talking to her husband or going to the emergency room with thoughts of self harm  Patient Self Care Activities:  . Self administers medications as prescribed . Attends all scheduled provider appointments . Performs ADL's independently . Performs IADL's independently  Initial goal documentation         The patient verbalized understanding of instructions provided today and declined a print copy of patient instruction materials.     Telephone follow up appointment with care management team member  scheduled for:09/17/18 at 10am

## 2018-09-17 ENCOUNTER — Ambulatory Visit: Payer: Self-pay | Admitting: *Deleted

## 2018-09-17 DIAGNOSIS — F32A Depression, unspecified: Secondary | ICD-10-CM

## 2018-09-17 DIAGNOSIS — F329 Major depressive disorder, single episode, unspecified: Secondary | ICD-10-CM

## 2018-09-17 DIAGNOSIS — Z8659 Personal history of other mental and behavioral disorders: Secondary | ICD-10-CM

## 2018-09-17 NOTE — Patient Instructions (Signed)
Thank you allowing the Chronic Care Management Team to be a part of your care! It was a pleasure speaking with you today!  1. Please feel free to call your CCM social worker with any questions or concerns regarding your mental health needs.  CCM (Chronic Care Management) Team   Trish Fountain RN, BSN Nurse Care Coordinator  [phone]  Ruben Reason PharmD  Clinical Pharmacist  [phone]   Elliot Gurney, LCSW Clinical Social Worker [phone]  Goals Addressed            This Visit's Progress   . "I want to manage my depression better" (pt-stated)       Current Barriers:  Marland Kitchen Mental Health Concerns   Clinical Social Work Clinical Goal(s):  Marland Kitchen Over the next 90 days, patient will work with this Education officer, museum  to address needs related to management of depressive symptoms  Interventions: . Patient interviewed and appropriate assessments performed . Provided mental health counseling with regard to depressed mood (mental health diagnosis or concern) . Processed patient's feelings regarding the pandemic and it's limits on daily life . Discussed plans with patient for ongoing care management follow up and provided patient with direct contact information for care management team . Followed up on plan to develop a schedule/daily list of priorities . Encouraged patient's continued development of positive self care strategies and daily schedule to promote purpose and to focus on the positive aspects of her life  . Reinforced safety plan of talking to her husband or going to the emergency room with thoughts of self harm  Patient Self Care Activities:  . Self administers medications as prescribed . Attends all scheduled provider appointments . Performs ADL's independently . Performs IADL's independently  Please see past updates related to this goal by clicking on the "Past Updates" button in the selected goal          The patient verbalized understanding of instructions  provided today and declined a print copy of patient instruction materials.   Telephone follow up appointment with care management team member scheduled for:10/01/18

## 2018-09-17 NOTE — Chronic Care Management (AMB) (Signed)
   Care Management    Clinical Social Work Follow Up Note  09/17/2018 Name: Shaunda Tipping MRN: [id number] DOB: Feb 25, 1976  Yailine Ballard is a 43 y.o. year old female who is a primary care patient of Paulene Floor. The CCM team was consulted for assistance with Mental Health Counseling and Resources.   Review of patient status, including review of consultants reports, other relevant assessments, and collaboration with appropriate care team members and the patient's provider was performed as part of comprehensive patient evaluation and provision of chronic care management services.     Goals Addressed            This Visit's Progress   . "I want to manage my depression better" (pt-stated)       Current Barriers:  Marland Kitchen Mental Health Concerns   Clinical Social Work Clinical Goal(s):  Marland Kitchen Over the next 90 days, patient will work with this Education officer, museum  to address needs related to management of depressive symptoms  Interventions: . Patient interviewed and appropriate assessments performed . Provided mental health counseling with regard to depressed mood (mental health diagnosis or concern) . Processed patient's feelings regarding the pandemic and it's limits on daily life . Discussed plans with patient for ongoing care management follow up and provided patient with direct contact information for care management team . Followed up on plan to develop a schedule/daily list of priorities . Encouraged patient's continued development of positive self care strategies and daily schedule to promote purpose and to focus on the positive aspects of her life  . Reinforced safety plan of talking to her husband or going to the emergency room with thoughts of self harm  Patient Self Care Activities:  . Self administers medications as prescribed . Attends all scheduled provider appointments . Performs ADL's independently . Performs IADL's independently  Please see past updates related to  this goal by clicking on the "Past Updates" button in the selected goal          Follow Up Plan: Appointment scheduled for SW follow up with client by phone on: 10/01/18    Elliot Gurney, [place] Worker  [facility] Care Management [phone]

## 2018-09-29 ENCOUNTER — Telehealth: Payer: Self-pay | Admitting: Physician Assistant

## 2018-09-29 DIAGNOSIS — M545 Low back pain, unspecified: Secondary | ICD-10-CM

## 2018-09-29 NOTE — Telephone Encounter (Signed)
Pt called wanting to know if Fabio Bering can send her for her back pain  to the same  doctor that she sent her husband to for knee pain.  Pt was referred to another doctor but that doc is not helping her.  CB# [phone]  teri

## 2018-09-29 NOTE — Telephone Encounter (Signed)
Referral to [facility] placed.  

## 2018-09-29 NOTE — Telephone Encounter (Signed)
Please review

## 2018-09-30 NOTE — Telephone Encounter (Signed)
Order placed. Please schedule

## 2018-09-30 NOTE — Telephone Encounter (Signed)
LMTCB to let patient know that the order was placed

## 2018-10-01 ENCOUNTER — Ambulatory Visit: Payer: Self-pay | Admitting: *Deleted

## 2018-10-01 DIAGNOSIS — F32A Depression, unspecified: Secondary | ICD-10-CM

## 2018-10-01 DIAGNOSIS — F329 Major depressive disorder, single episode, unspecified: Secondary | ICD-10-CM

## 2018-10-01 DIAGNOSIS — Z8659 Personal history of other mental and behavioral disorders: Secondary | ICD-10-CM

## 2018-10-04 NOTE — Chronic Care Management (AMB) (Signed)
  Chronic Care Management    Clinical Social Work Follow Up Note  10/04/2018 Name: Renleigh Ouellet MRN: [id number] DOB: 04/06/1975  Selah Klang is a 43 y.o. year old female who is a primary care patient of Paulene Floor. The CCM team was consulted for assistance with Mental Health Counseling and Resources.   Review of patient status, including review of consultants reports, other relevant assessments, and collaboration with appropriate care team members and the patient's provider was performed as part of comprehensive patient evaluation and provision of chronic care management services.     Goals Addressed            This Visit's Progress   . "I want to manage my depression better" (pt-stated)       Current Barriers:  Marland Kitchen Mental Health Concerns   Clinical Social Work Clinical Goal(s):  Marland Kitchen Over the next 90 days, patient will work with this Education officer, museum  to address needs related to management of depressive symptoms  Interventions: . Patient interviewed and appropriate assessments performed . Provided mental health counseling with regard to depressed mood  . Continued to process patient's feelings regarding the current pandemic and multiple adjustments experienced as a result . Discussed plans with patient for ongoing care management follow up and provided patient with direct contact information for care management team . Followed up on plan to develop a schedule/daily list of priorities including focusing on things that are in her control . Encouraged patient's continued development of positive self care strategies to add balance to her life  Patient Self Care Activities:  . Self administers medications as prescribed . Attends all scheduled provider appointments . Performs ADL's independently . Performs IADL's independently  Please see past updates related to this goal by clicking on the "Past Updates" button in the selected goal          Follow Up Plan: SW will  follow up with patient by phone over the next 2 weeks    [place], [place] Worker  [place] [facility] [phone]

## 2018-10-04 NOTE — Patient Instructions (Signed)
Thank you allowing the Chronic Care Management Team to be a part of your care! It was a pleasure speaking with you today!  1. Please continue to use the self care strategies discussed 2. Please call this social worker with any questions or concerns regarding your mental health needs.  CCM (Chronic Care Management) Team   Trish Fountain RN, BSN Nurse Care Coordinator  [phone]  Ruben Reason PharmD  Clinical Pharmacist  [phone]   Elliot Gurney, LCSW Clinical Social Worker [phone]  Goals Addressed            This Visit's Progress   . "I want to manage my depression better" (pt-stated)       Current Barriers:  Marland Kitchen Mental Health Concerns   Clinical Social Work Clinical Goal(s):  Marland Kitchen Over the next 90 days, patient will work with this Education officer, museum  to address needs related to management of depressive symptoms  Interventions: . Patient interviewed and appropriate assessments performed . Provided mental health counseling with regard to depressed mood  . Continued to process patient's feelings regarding the current pandemic and multiple adjustments experienced as a result . Discussed plans with patient for ongoing care management follow up and provided patient with direct contact information for care management team . Followed up on plan to develop a schedule/daily list of priorities including focusing on things that are in her control . Encouraged patient's continued development of positive self care strategies to add balance to her life  Patient Self Care Activities:  . Self administers medications as prescribed . Attends all scheduled provider appointments . Performs ADL's independently . Performs IADL's independently  Please see past updates related to this goal by clicking on the "Past Updates" button in the selected goal          The patient verbalized understanding of instructions provided today and declined a print copy of patient instruction  materials.   Telephone follow up appointment with care management team member scheduled for: 10/15/18

## 2018-10-06 ENCOUNTER — Other Ambulatory Visit: Payer: Self-pay | Admitting: Family Medicine

## 2018-10-06 DIAGNOSIS — F329 Major depressive disorder, single episode, unspecified: Secondary | ICD-10-CM

## 2018-10-06 DIAGNOSIS — F32A Depression, unspecified: Secondary | ICD-10-CM

## 2018-10-15 ENCOUNTER — Ambulatory Visit: Payer: Self-pay | Admitting: *Deleted

## 2018-10-15 ENCOUNTER — Telehealth: Payer: Self-pay

## 2018-10-15 NOTE — Chronic Care Management (AMB) (Signed)
  Chronic Care Management   Social Work Note  10/15/2018 Name: Brianna Brown MRN: [id number] DOB: June 04, 1975  Brianna Brown is a 43 y.o. year old female who sees Trinna Post, [place] for primary care. The CCM team was consulted for assistance with Mental Health Counseling and Resources.   Phone call to patient for scheduled appointment. Per patient she had a sudden change in schedule and needed to re-schedule.   Goals Addressed   None     Follow Up Plan: Appointment scheduled for SW follow up with client by phone on: 10/20/2018.   Elliot Gurney, [place] Worker  [facility] [phone]

## 2018-10-20 ENCOUNTER — Ambulatory Visit: Payer: Self-pay | Admitting: *Deleted

## 2018-10-20 DIAGNOSIS — F32A Depression, unspecified: Secondary | ICD-10-CM

## 2018-10-20 DIAGNOSIS — Z8659 Personal history of other mental and behavioral disorders: Secondary | ICD-10-CM

## 2018-10-20 DIAGNOSIS — F329 Major depressive disorder, single episode, unspecified: Secondary | ICD-10-CM

## 2018-10-20 NOTE — Patient Instructions (Signed)
Thank you allowing the Chronic Care Management Team to be a part of your care! It was a pleasure speaking with you today!  1. Please continue to use the self care strategies discussed today 2. Please call this social worker with any questions regarding your mental health needs   CCM (Chronic Care Management) Team   Trish Fountain RN, BSN Nurse Care Coordinator  [phone]  Ruben Reason PharmD  Clinical Pharmacist  [phone]   Elliot Gurney, LCSW Clinical Social Worker [phone]  Goals Addressed            This Visit's Progress   . "I want to manage my depression better" (pt-stated)       Current Barriers:  Marland Kitchen Mental Health Concerns   Clinical Social Work Clinical Goal(s):  Marland Kitchen Over the next 90 days, patient will work with this Education officer, museum  to address needs related to management of depressive symptoms  Interventions: . Patient interviewed and appropriate assessments performed . Provided mental health counseling with regard to patient's depressed mood  . Continued to process patient's feelings regarding the current pandemic and multiple adjustments experienced as a result . Continued to process patient's feelings regarding work stress and need to communicate her needs effectively . Confirmed decrease in depressive symptoms, stating that her days are looking brighter . Discussed plans with patient for ongoing care management follow up and provided patient with direct contact information for care management team . Encouraged patient's continued development of positive self care strategies to add balance to her life  Patient Self Care Activities:  . Self administers medications as prescribed . Attends all scheduled provider appointments . Performs ADL's independently . Performs IADL's independently  Please see past updates related to this goal by clicking on the "Past Updates" button in the selected goal          The patient verbalized understanding of  instructions provided today and declined a print copy of patient instruction materials.   Telephone follow up appointment with care management team member scheduled for:11/05/18

## 2018-10-20 NOTE — Chronic Care Management (AMB) (Signed)
   Care Management    Clinical Social Work Follow Up Note  10/20/2018 Name: [name] MRN: [id number] DOB: 1975-11-11  Brianna Brown is a 43 y.o. year old female who is a primary care patient of Paulene Floor. The CCM team was consulted for assistance with Mental Health Counseling and Resources.   Review of patient status, including review of consultants reports, other relevant assessments, and collaboration with appropriate care team members and the patient's provider was performed as part of comprehensive patient evaluation and provision of chronic care management services.     Goals Addressed            This Visit's Progress   . "I want to manage my depression better" (pt-stated)       Current Barriers:  Marland Kitchen Mental Health Concerns   Clinical Social Work Clinical Goal(s):  Marland Kitchen Over the next 90 days, patient will work with this Education officer, museum  to address needs related to management of depressive symptoms  Interventions: . Patient interviewed and appropriate assessments performed . Provided mental health counseling with regard to patient's depressed mood  . Continued to process patient's feelings regarding the current pandemic and multiple adjustments experienced as a result . Continued to process patient's feelings regarding work stress and need to communicate her needs effectively . Confirmed decrease in depressive symptoms, stating that her days are looking brighter . Discussed plans with patient for ongoing care management follow up and provided patient with direct contact information for care management team . Encouraged patient's continued development of positive self care strategies to add balance to her life  Patient Self Care Activities:  . Self administers medications as prescribed . Attends all scheduled provider appointments . Performs ADL's independently . Performs IADL's independently  Please see past updates related to this goal by clicking on the  "Past Updates" button in the selected goal          Follow Up Plan: Appointment scheduled for SW follow up with client by phone on: 11/05/18    Elliot Gurney, [place] Worker  [facility] [phone]

## 2018-11-05 ENCOUNTER — Encounter: Payer: Self-pay | Admitting: *Deleted

## 2018-11-05 ENCOUNTER — Ambulatory Visit: Payer: Self-pay | Admitting: *Deleted

## 2018-11-05 NOTE — Progress Notes (Signed)
This encounter was created in error - please disregard.

## 2018-11-05 NOTE — Chronic Care Management (AMB) (Signed)
   Care Management   Social Work Note  11/05/2018 Name: Brianna Brown MRN: [id number] DOB: 1975/08/12  Brianna Brown is a 43 y.o. year old female who sees Trinna Post, [place] for primary care. The CCM team was consulted for assistance with Mental Health Counseling and Resources.   Phone call to patient today for scheduled follow up appointment. Patient was not available today and requested to re-schedule the appointment.    Outpatient Encounter Medications as of 11/05/2018  Medication Sig  . albuterol (PROVENTIL HFA;VENTOLIN HFA) 108 (90 Base) MCG/ACT inhaler Inhale 2 puffs into the lungs every 4 (four) hours as needed for wheezing or shortness of breath. (Patient not taking: Reported on 08/17/2018)  . gabapentin (NEURONTIN) 100 MG capsule TAKE ONE CAPSULE BY MOUTH THREE TIMES DAILY  . levothyroxine (SYNTHROID) 25 MCG tablet TAKE 1/2 TABLET(12.5 MCG) BY MOUTH DAILY BEFORE BREAKFAST  . sertraline (ZOLOFT) 100 MG tablet TAKE 1 AND 1/2 TABLETS(150 MG) BY MOUTH DAILY   No facility-administered encounter medications on file as of 11/05/2018.     Goals Addressed   None     Follow Up Plan: Appointment scheduled for SW follow up with client by phone on: 11/12/18 at [place], [place] Worker  [facility] [phone]

## 2018-11-12 ENCOUNTER — Ambulatory Visit: Payer: Self-pay | Admitting: *Deleted

## 2018-11-12 NOTE — Chronic Care Management (AMB) (Signed)
   Care Management    Clinical Social Work Follow Up Note  11/12/2018 Name: Brianna Brown MRN: [id number] DOB: February 15, 1976  Brianna Brown is a 43 y.o. year old female who is a primary care patient of Paulene Floor. The CCM team was consulted for assistance with Mental Health Counseling and Resources.   Review of patient status, including review of consultants reports, other relevant assessments, and collaboration with appropriate care team members and the patient's provider was performed as part of comprehensive patient evaluation and provision of chronic care management services.     Outpatient Encounter Medications as of 11/12/2018  Medication Sig  . albuterol (PROVENTIL HFA;VENTOLIN HFA) 108 (90 Base) MCG/ACT inhaler Inhale 2 puffs into the lungs every 4 (four) hours as needed for wheezing or shortness of breath. (Patient not taking: Reported on 08/17/2018)  . gabapentin (NEURONTIN) 100 MG capsule TAKE ONE CAPSULE BY MOUTH THREE TIMES DAILY  . levothyroxine (SYNTHROID) 25 MCG tablet TAKE 1/2 TABLET(12.5 MCG) BY MOUTH DAILY BEFORE BREAKFAST  . sertraline (ZOLOFT) 100 MG tablet TAKE 1 AND 1/2 TABLETS(150 MG) BY MOUTH DAILY   No facility-administered encounter medications on file as of 11/12/2018.      Goals Addressed            This Visit's Progress   . "I want to manage my depression better" (pt-stated)       Current Barriers:  Marland Kitchen Mental Health Concerns   Clinical Social Work Clinical Goal(s):  Marland Kitchen Over the next 90 days, patient will work with this Education officer, museum  to address needs related to management of depressive symptoms  Interventions: . Patient interviewed and appropriate assessments performed . Provided mental health counseling and support regarding issues related to balancing work and family due to the pandemic . Processed patient's feelings regarding recent conflict with a close friend who is dealing with a serious medical condition . Current work stress explored,  following up on use of effective communication to address concerns . Confirmed decrease in depressive symptoms as she develops stronger boundaries and priorities . Discussed plans with patient for ongoing care management follow up and provided patient with direct contact information for care management team . Encouraged patient's continued development of positive self care strategies to add balance to her life  Patient Self Care Activities:  . Self administers medications as prescribed . Attends all scheduled provider appointments . Performs ADL's independently . Performs IADL's independently  Please see past updates related to this goal by clicking on the "Past Updates" button in the selected goal          Follow Up Plan: Appointment scheduled for SW follow up with client by phone on: 11/26/18    Elliot Gurney, [place] Worker  [place] [facility] [phone]

## 2018-11-26 ENCOUNTER — Ambulatory Visit: Payer: Self-pay | Admitting: *Deleted

## 2018-11-26 DIAGNOSIS — F329 Major depressive disorder, single episode, unspecified: Secondary | ICD-10-CM

## 2018-11-26 DIAGNOSIS — Z8659 Personal history of other mental and behavioral disorders: Secondary | ICD-10-CM

## 2018-11-26 DIAGNOSIS — F32A Depression, unspecified: Secondary | ICD-10-CM

## 2018-11-26 NOTE — Patient Instructions (Signed)
Thank you allowing the Chronic Care Management Team to be a part of your care! It was a pleasure speaking with you today!  1. Please call this social worker with any questions or concerns regarding your mental health needs.  CCM (Chronic Care Management) Team   Ruben Reason PharmD  Clinical Pharmacist  [phone]   [place], LCSW Clinical Social Worker [phone]  Goals Addressed            This Visit's Progress   . "I want to manage my depression better" (pt-stated)       Current Barriers:  Marland Kitchen Mental Health Concerns   Clinical Social Work Clinical Goal(s):  Marland Kitchen Over the next 90 days, patient will work with this Education officer, museum  to address needs related to management of depressive symptoms  Interventions: . Patient interviewed and appropriate assessments performed . Continued to provide mental health counseling and support regarding issues related to balancing work and family due to the pandemic and it's limitations . Discussed re-establishing a daily schedule to maintain structure and work/life balance . Current work stress explored, following up on use of effective communication to address concerns, having realistic expectations emphasized as well as placing focus on the things within her control . Continued to confirm a decrease in depressive symptoms as she develops stronger boundaries and priorities . Discussed plans with patient for ongoing care management follow up and provided patient with direct contact information for care management team . Encouraged patient's continued development of positive self care strategies to add balance to her life  Patient Self Care Activities:  . Self administers medications as prescribed . Attends all scheduled provider appointments . Performs ADL's independently . Performs IADL's independently  Please see past updates related to this goal by clicking on the "Past Updates" button in the selected goal          The  patient verbalized understanding of instructions provided today and declined a print copy of patient instruction materials.   Telephone follow up appointment with care management team member scheduled for:12/10/18

## 2018-11-26 NOTE — Chronic Care Management (AMB) (Signed)
   Care Management    Clinical Social Work General Follow Up Note  11/26/2018 Name: Brianna Brown MRN: [id number] DOB: November 23, 1975  Brianna Brown is a 43 y.o. year old female who is a primary care patient of Paulene Floor. The CCM team was consulted for assistance with Mental Health Counseling and Resources.   Review of patient status, including review of consultants reports, relevant laboratory and other test results, and collaboration with appropriate care team members and the patient's provider was performed as part of comprehensive patient evaluation and provision of chronic care management services.     Outpatient Encounter Medications as of 11/26/2018  Medication Sig  . albuterol (PROVENTIL HFA;VENTOLIN HFA) 108 (90 Base) MCG/ACT inhaler Inhale 2 puffs into the lungs every 4 (four) hours as needed for wheezing or shortness of breath. (Patient not taking: Reported on 08/17/2018)  . gabapentin (NEURONTIN) 100 MG capsule TAKE ONE CAPSULE BY MOUTH THREE TIMES DAILY  . levothyroxine (SYNTHROID) 25 MCG tablet TAKE 1/2 TABLET(12.5 MCG) BY MOUTH DAILY BEFORE BREAKFAST  . sertraline (ZOLOFT) 100 MG tablet TAKE 1 AND 1/2 TABLETS(150 MG) BY MOUTH DAILY   No facility-administered encounter medications on file as of 11/26/2018.     Goals Addressed            This Visit's Progress   . "I want to manage my depression better" (pt-stated)       Current Barriers:  Marland Kitchen Mental Health Concerns   Clinical Social Work Clinical Goal(s):  Marland Kitchen Over the next 90 days, patient will work with this Education officer, museum  to address needs related to management of depressive symptoms  Interventions: . Patient interviewed and appropriate assessments performed . Continued to provide mental health counseling and support regarding issues related to balancing work and family due to the pandemic and it's limitations . Discussed re-establishing a daily schedule to maintain structure and work/life balance . Current  work stress explored, following up on use of effective communication to address concerns, having realistic expectations emphasized as well as placing focus on the things within her control . Continued to confirm a decrease in depressive symptoms as she develops stronger boundaries and priorities . Discussed plans with patient for ongoing care management follow up and provided patient with direct contact information for care management team . Encouraged patient's continued development of positive self care strategies to add balance to her life  Patient Self Care Activities:  . Self administers medications as prescribed . Attends all scheduled provider appointments . Performs ADL's independently . Performs IADL's independently  Please see past updates related to this goal by clicking on the "Past Updates" button in the selected goal           Follow Up Plan: Appointment scheduled for SW follow up with client by phone on: 12/10/18     Elliot Gurney, [place] Worker  [facility] [phone]

## 2018-12-10 ENCOUNTER — Other Ambulatory Visit: Payer: Self-pay | Admitting: Physician Assistant

## 2018-12-10 ENCOUNTER — Ambulatory Visit: Payer: Self-pay | Admitting: *Deleted

## 2018-12-10 ENCOUNTER — Telehealth: Payer: Self-pay | Admitting: *Deleted

## 2018-12-10 DIAGNOSIS — E039 Hypothyroidism, unspecified: Secondary | ICD-10-CM

## 2018-12-10 MED ORDER — LEVOTHYROXINE SODIUM 25 MCG PO TABS: ORAL_TABLET | ORAL | 0 refills | Status: DC

## 2018-12-10 NOTE — Chronic Care Management (AMB) (Signed)
   Care Management   Social Work Note  12/10/2018 Name: Brianna Brown MRN: [id number] DOB: October 22, 1975  Brianna Brown is a 43 y.o. year old female who sees Trinna Post, [place] for primary care. The CCM team was consulted for assistance with Mental Health Counseling and Resources.   Return phone call from patient apologizing that she had overslept and needed to re-schedule. Appointment re-scheduled for 12/24/18.  Outpatient Encounter Medications as of 12/10/2018  Medication Sig  . albuterol (PROVENTIL HFA;VENTOLIN HFA) 108 (90 Base) MCG/ACT inhaler Inhale 2 puffs into the lungs every 4 (four) hours as needed for wheezing or shortness of breath. (Patient not taking: Reported on 08/17/2018)  . gabapentin (NEURONTIN) 100 MG capsule TAKE ONE CAPSULE BY MOUTH THREE TIMES DAILY  . levothyroxine (SYNTHROID) 25 MCG tablet TAKE 1/2 TABLET(12.5 MCG) BY MOUTH DAILY BEFORE BREAKFAST  . sertraline (ZOLOFT) 100 MG tablet TAKE 1 AND 1/2 TABLETS(150 MG) BY MOUTH DAILY   No facility-administered encounter medications on file as of 12/10/2018.     Goals Addressed   None     Follow Up Plan: Appointment scheduled for SW follow up with client by phone on: 12/24/18   Elliot Gurney, [place] Worker  [facility] [phone]

## 2018-12-10 NOTE — Telephone Encounter (Signed)
[facility] faxed refill request for the following medications:  levothyroxine (SYNTHROID) 25 MCG tablet   Please advise.  

## 2018-12-10 NOTE — Chronic Care Management (AMB) (Signed)
    Care Management   Unsuccessful Call Note 12/10/2018 Name: Brianna Brown MRN: [id number] DOB: [birth year]  Patient  is a 42 year old female who sees Carles Collet, [place] for primary care. Carles Collet, PA-C asked the CCM team to consult the patient for Mental Health Counseling and Resources.    This social worker was unable to reach patient via telephone today for scheduled appointment . I have left HIPAA compliant voicemail asking patient to return my call. (unsuccessful outreach #1).   Plan: Will follow-up within 14 business days via telephone.      Elliot Gurney, [place] Worker  [facility] [phone]

## 2018-12-10 NOTE — Telephone Encounter (Signed)
Please call patient and remind her about TSH lab, not drawn since 03/2017.

## 2018-12-14 ENCOUNTER — Other Ambulatory Visit: Payer: Self-pay

## 2018-12-14 ENCOUNTER — Ambulatory Visit (INDEPENDENT_AMBULATORY_CARE_PROVIDER_SITE_OTHER): Admitting: Physician Assistant

## 2018-12-14 ENCOUNTER — Encounter: Payer: Self-pay | Admitting: Physician Assistant

## 2018-12-14 VITALS — BP 101/69 | HR 62 | Temp 97.3°F | Resp 16 | Ht 63.0 in | Wt 120.0 lb

## 2018-12-14 DIAGNOSIS — Z808 Family history of malignant neoplasm of other organs or systems: Secondary | ICD-10-CM | POA: Diagnosis not present

## 2018-12-14 DIAGNOSIS — Z23 Encounter for immunization: Secondary | ICD-10-CM

## 2018-12-14 DIAGNOSIS — E039 Hypothyroidism, unspecified: Secondary | ICD-10-CM | POA: Diagnosis not present

## 2018-12-14 DIAGNOSIS — R519 Headache, unspecified: Secondary | ICD-10-CM | POA: Diagnosis not present

## 2018-12-14 NOTE — Progress Notes (Signed)
Patient: Brianna Brown Female    DOB: [birth year]   43 y.o.   MRN: [id number] Visit Date: 12/14/2018  Today's Provider: Trey Sailors, PA-C   Chief Complaint  Patient presents with  . Hypothyroidism   Subjective:     HPI  Hypothyroid, follow-up:  TSH  Date Value Ref Range Status  03/12/2017 1.590 0.450 - 4.500 uIU/mL Final  10/31/2015 1.50 0.41 - 5.90 Final   Wt Readings from Last 3 Encounters:  12/14/18 120 lb (54.4 kg)  08/17/18 108 lb (49 kg)  04/05/18 117 lb (53.1 kg)    She was last seen for hypothyroid 4 months ago. Currently taking synthroid 12.5 mcg daily. Management since that visit includes labs ordered. She reports excellent compliance with treatment. She is not having side effects.  She is exercising. She is experiencing none She denies change in energy level Weight trend: stable  Reports recently her sister had a seizure at aged 45. She was found awake and completely unresponsive. Sister ended up having a brain tumor that was cancerous. Patient does not know specifically what cancer. Sister had tumor removed and is currently doing oral chemo. Sister's oncologist mentioned that her relatives should get checked.   Patient reports that she has been having headaches for the past three days. She denies worst headache of her life, vision change, difficulty with speech, numbness, weakness, tingling, confusion, imbalance, nausea, and vomiting.  ------------------------------------------------------------------------   No Known Allergies   Current Outpatient Medications:  .  levothyroxine (SYNTHROID) 25 MCG tablet, TAKE 1/2 TABLET(12.5 MCG) BY MOUTH DAILY BEFORE BREAKFAST, Disp: 45 tablet, Rfl: 0 .  sertraline (ZOLOFT) 100 MG tablet, TAKE 1 AND 1/2 TABLETS(150 MG) BY MOUTH DAILY, Disp: 135 tablet, Rfl: 1 .  albuterol (PROVENTIL HFA;VENTOLIN HFA) 108 (90 Base) MCG/ACT inhaler, Inhale 2 puffs into the lungs every 4 (four) hours as needed for wheezing or  shortness of breath. (Patient not taking: Reported on 08/17/2018), Disp: 6.7 g, Rfl: 3  Review of Systems  Social History   Tobacco Use  . Smoking status: Never Smoker  . Smokeless tobacco: Never Used  Substance Use Topics  . Alcohol use: Yes    Frequency: Never    Comment: Rarely       Objective:   BP 101/69 (BP Location: Left Arm, Patient Position: Sitting, Cuff Size: Normal)   Pulse 62   Temp (!) 97.3 F (36.3 C) (Temporal)   Resp 16   Ht 5\' 3"  (1.6 m)   Wt 120 lb (54.4 kg)   BMI 21.26 kg/m  Vitals:   12/14/18 1053  BP: 101/69  Pulse: 62  Resp: 16  Temp: (!) 97.3 F (36.3 C)  TempSrc: Temporal  Weight: 120 lb (54.4 kg)  Height: 5\' 3"  (1.6 m)  Body mass index is 21.26 kg/m.   Physical Exam Constitutional:      Appearance: Normal appearance.  Cardiovascular:     Rate and Rhythm: Normal rate and regular rhythm.  Pulmonary:     Effort: Pulmonary effort is normal.     Breath sounds: Normal breath sounds.  Skin:    General: Skin is warm and dry.  Neurological:     Mental Status: She is alert and oriented to person, place, and time. Mental status is at baseline.  Psychiatric:        Mood and Affect: Mood normal.        Behavior: Behavior normal.      No results found for any  visits on 12/14/18.     Assessment & Plan    1. Hypothyroidism, unspecified type  - TSH  2. Need for influenza vaccination  - Flu Vaccine QUAD 36+ mos IM  3. Family history of brain cancer   4. Nonintractable headache, unspecified chronicity pattern, unspecified headache type  Unsure what type of cancer her sister had and am unable to make recommendation regarding further screening. If she is able to determine what the type of cancer is, I think she would be best served by seeing a neurologist or oncologist for further screening. She has no alarm signs or symptoms regarding her headaches today though if they persist for 4+ weeks I think it would be reasonable to pursue a CT  Head. She may message me back in either case.  The entirety of the information documented in the History of Present Illness, Review of Systems and Physical Exam were personally obtained by me. Portions of this information were initially documented by Ashley Royalty, CMA and reviewed by me for thoroughness and accuracy.   F/u 6 months to 1 year for thyroid and depression         Trinna Post, PA-C  [place]

## 2018-12-14 NOTE — Patient Instructions (Signed)
Hypothyroidism  Hypothyroidism is when the thyroid gland does not make enough of certain hormones (it is underactive). The thyroid gland is a small gland located in the lower front part of the neck, just in front of the windpipe (trachea). This gland makes hormones that help control how the body uses food for energy (metabolism) as well as how the heart and brain function. These hormones also play a role in keeping your bones strong. When the thyroid is underactive, it produces too little of the hormones thyroxine (T4) and triiodothyronine (T3). What are the causes? This condition may be caused by:  Hashimoto's disease. This is a disease in which the body's disease-fighting system (immune system) attacks the thyroid gland. This is the most common cause.  Viral infections.  Pregnancy.  Certain medicines.  Birth defects.  Past radiation treatments to the head or neck for cancer.  Past treatment with radioactive iodine.  Past exposure to radiation in the environment.  Past surgical removal of part or all of the thyroid.  Problems with a gland in the center of the brain (pituitary gland).  Lack of enough iodine in the diet. What increases the risk? You are more likely to develop this condition if:  You are female.  You have a family history of thyroid conditions.  You use a medicine called lithium.  You take medicines that affect the immune system (immunosuppressants). What are the signs or symptoms? Symptoms of this condition include:  Feeling as though you have no energy (lethargy).  Not being able to tolerate cold.  Weight gain that is not explained by a change in diet or exercise habits.  Lack of appetite.  Dry skin.  Coarse hair.  Menstrual irregularity.  Slowing of thought processes.  Constipation.  Sadness or depression. How is this diagnosed? This condition may be diagnosed based on:  Your symptoms, your medical history, and a physical exam.  Blood  tests. You may also have imaging tests, such as an ultrasound or MRI. How is this treated? This condition is treated with medicine that replaces the thyroid hormones that your body does not make. After you begin treatment, it may take several weeks for symptoms to go away. Follow these instructions at home:  Take over-the-counter and prescription medicines only as told by your health care provider.  If you start taking any new medicines, tell your health care provider.  Keep all follow-up visits as told by your health care provider. This is important. ? As your condition improves, your dosage of thyroid hormone medicine may change. ? You will need to have blood tests regularly so that your health care provider can monitor your condition. Contact a health care provider if:  Your symptoms do not get better with treatment.  You are taking thyroid replacement medicine and you: ? Sweat a lot. ? Have tremors. ? Feel anxious. ? Lose weight rapidly. ? Cannot tolerate heat. ? Have emotional swings. ? Have diarrhea. ? Feel weak. Get help right away if you have:  Chest pain.  An irregular heartbeat.  A rapid heartbeat.  Difficulty breathing. Summary  Hypothyroidism is when the thyroid gland does not make enough of certain hormones (it is underactive).  When the thyroid is underactive, it produces too little of the hormones thyroxine (T4) and triiodothyronine (T3).  The most common cause is Hashimoto's disease, a disease in which the body's disease-fighting system (immune system) attacks the thyroid gland. The condition can also be caused by viral infections, medicine, pregnancy, or past   radiation treatment to the head or neck.  Symptoms may include weight gain, dry skin, constipation, feeling as though you do not have energy, and not being able to tolerate cold.  This condition is treated with medicine to replace the thyroid hormones that your body does not make. This information  is not intended to replace advice given to you by your health care provider. Make sure you discuss any questions you have with your health care provider. Document Released: 02/24/2005 Document Revised: 02/06/2017 Document Reviewed: 02/04/2017 Elsevier Patient Education  2020 Elsevier Inc[employer]  

## 2018-12-17 ENCOUNTER — Telehealth: Payer: Self-pay

## 2018-12-24 ENCOUNTER — Ambulatory Visit: Payer: Self-pay | Admitting: *Deleted

## 2018-12-24 ENCOUNTER — Telehealth: Payer: Self-pay

## 2018-12-24 DIAGNOSIS — F329 Major depressive disorder, single episode, unspecified: Secondary | ICD-10-CM

## 2018-12-24 DIAGNOSIS — F32A Depression, unspecified: Secondary | ICD-10-CM

## 2018-12-24 DIAGNOSIS — Z8659 Personal history of other mental and behavioral disorders: Secondary | ICD-10-CM

## 2018-12-24 NOTE — Patient Instructions (Signed)
Thank you allowing the Chronic Care Management Team to be a part of your care! It was a pleasure speaking with you today!  1. Please call this social worker with nay questions or concerns regarding your mental health needs.  CCM (Chronic Care Management) Team   Neldon Labella  RN, BSN Nurse Care Coordinator  [phone]  Ruben Reason PharmD  Clinical Pharmacist  [phone]   Elliot Gurney, LCSW Clinical Social Worker [phone]  Goals Addressed            This Visit's Progress   . "I want to manage my depression better" (pt-stated)       Current Barriers:  Marland Kitchen Mental Health Concerns   Clinical Social Work Clinical Goal(s):  Marland Kitchen Over the next 90 days, patient will work with this Education officer, museum  to address needs related to management of depressive symptoms  Interventions: . Patient interviewed and appropriate assessments performed . Continued to provide mental health counseling and support regarding issues related to work conflicts . Discussed the importance of maintaining boundaries and increasing communication to address concerns, . Reinforced need to have realistic expectations emphasizing placing focus on the things within her control . Continued to confirm a decrease in depressive symptoms as she develops stronger boundaries and priorities . Discussed plans with patient for ongoing care management follow up and provided patient with direct contact information for care management team . Encouraged patient's continued development of positive self care strategies to add balance to her life  Patient Self Care Activities:  . Self administers medications as prescribed . Attends all scheduled provider appointments . Performs ADL's independently . Performs IADL's independently  Please see past updates related to this goal by clicking on the "Past Updates" button in the selected goal          The patient verbalized understanding of instructions provided today and  declined a print copy of patient instruction materials.    Telephone follow up appointment with care management team member scheduled for:01/14/19

## 2018-12-24 NOTE — Chronic Care Management (AMB) (Signed)
  Chronic Care Management     Social Work Follow Up Note  12/24/2018 Name: Brianna Brown MRN: [id number] DOB: September 30, 1975  Brianna Brown is a 43 y.o. year old female who is a primary care patient of Paulene Floor. The CCM team was consulted for assistance with Mental Health Counseling and Resources.   Review of patient status, including review of consultants reports, other relevant assessments, and collaboration with appropriate care team members and the patient's provider was performed as part of comprehensive patient evaluation and provision of chronic care management services.     Advanced Directives Status: <no information> See Care Plan for related entries.   Outpatient Encounter Medications as of 12/24/2018  Medication Sig  . albuterol (PROVENTIL HFA;VENTOLIN HFA) 108 (90 Base) MCG/ACT inhaler Inhale 2 puffs into the lungs every 4 (four) hours as needed for wheezing or shortness of breath. (Patient not taking: Reported on 08/17/2018)  . levothyroxine (SYNTHROID) 25 MCG tablet TAKE 1/2 TABLET(12.5 MCG) BY MOUTH DAILY BEFORE BREAKFAST  . sertraline (ZOLOFT) 100 MG tablet TAKE 1 AND 1/2 TABLETS(150 MG) BY MOUTH DAILY   No facility-administered encounter medications on file as of 12/24/2018.      Goals Addressed            This Visit's Progress   . "I want to manage my depression better" (pt-stated)       Current Barriers:  Marland Kitchen Mental Health Concerns   Clinical Social Work Clinical Goal(s):  Marland Kitchen Over the next 90 days, patient will work with this Education officer, museum  to address needs related to management of depressive symptoms  Interventions: . Patient interviewed and appropriate assessments performed . Continued to provide mental health counseling and support regarding issues related to work conflicts . Discussed the importance of maintaining boundaries and increasing communication to address concerns, . Reinforced need to have realistic expectations emphasizing placing  focus on the things within her control . Continued to confirm a decrease in depressive symptoms as she develops stronger boundaries and priorities . Discussed plans with patient for ongoing care management follow up and provided patient with direct contact information for care management team . Encouraged patient's continued development of positive self care strategies to add balance to her life  Patient Self Care Activities:  . Self administers medications as prescribed . Attends all scheduled provider appointments . Performs ADL's independently . Performs IADL's independently  Please see past updates related to this goal by clicking on the "Past Updates" button in the selected goal          Follow Up Plan: SW will follow up with patient by phone over the next 3 weeks    [place], [place] Worker  [place] [facility] [phone]

## 2019-01-14 ENCOUNTER — Ambulatory Visit: Payer: Self-pay | Admitting: *Deleted

## 2019-01-14 NOTE — Chronic Care Management (AMB) (Signed)
   Care Management   Social Work Note  01/14/2019 Name: Brianna Brown MRN: [id number] DOB: [birth year]  Brianna Brown is a 43 y.o. year old female who sees Trinna Post, [place] for primary care. The CCM team was consulted for assistance with Mental Health Counseling and Resources.  Phone call to patient for scheduled appointment. Patient requested that the appointment be rescheduled. Appointment rescheduled for 01/21/19.    Outpatient Encounter Medications as of 01/14/2019  Medication Sig  . albuterol (PROVENTIL HFA;VENTOLIN HFA) 108 (90 Base) MCG/ACT inhaler Inhale 2 puffs into the lungs every 4 (four) hours as needed for wheezing or shortness of breath. (Patient not taking: Reported on 08/17/2018)  . levothyroxine (SYNTHROID) 25 MCG tablet TAKE 1/2 TABLET(12.5 MCG) BY MOUTH DAILY BEFORE BREAKFAST  . sertraline (ZOLOFT) 100 MG tablet TAKE 1 AND 1/2 TABLETS(150 MG) BY MOUTH DAILY   No facility-administered encounter medications on file as of 01/14/2019.     Goals Addressed   None     Follow Up Plan: Appointment scheduled for SW follow up with client by phone on: 01/21/19   Elliot Gurney, [place] Worker  [facility] [phone]

## 2019-01-21 ENCOUNTER — Ambulatory Visit: Payer: Self-pay | Admitting: *Deleted

## 2019-01-21 DIAGNOSIS — F329 Major depressive disorder, single episode, unspecified: Secondary | ICD-10-CM

## 2019-01-21 DIAGNOSIS — F32A Depression, unspecified: Secondary | ICD-10-CM

## 2019-01-21 DIAGNOSIS — Z8659 Personal history of other mental and behavioral disorders: Secondary | ICD-10-CM

## 2019-01-21 NOTE — Patient Instructions (Signed)
Thank you allowing the Chronic Care Management Team to be a part of your care! It was a pleasure speaking with you today!  1. Please call this social worker with any questions or concerns regarding your mental health needs  CCM (Chronic Care Management) Team   Neldon Labella RN, BSN Nurse Care Coordinator  [phone]  Ruben Reason PharmD  Clinical Pharmacist  [phone]   [place], LCSW Clinical Social Worker [phone]  Goals Addressed            This Visit's Progress   . "I want to manage my depression better" (pt-stated)       Current Barriers:  Marland Kitchen Mental Health Concerns   Clinical Social Work Clinical Goal(s):  Marland Kitchen Over the next 90 days, patient will work with this Education officer, museum  to address needs related to management of depressive symptoms  Interventions: . Patient interviewed and appropriate assessments performed . Continued to provide mental health counseling and support regarding issues related to work conflicts that brings on increased stress . Continued to discussed the importance of maintaining boundaries and increasing communication to address concerns, . Reinforced need to adjust her perspective and to remember her ultimate goal  . Discussed plans with patient for ongoing care management follow up and provided patient with direct contact information for care management team . Encouraged patient's continued development of positive self care strategies to add balance to her life  Patient Self Care Activities:  . Self administers medications as prescribed . Attends all scheduled provider appointments . Performs ADL's independently . Performs IADL's independently  Please see past updates related to this goal by clicking on the "Past Updates" button in the selected goal          The patient verbalized understanding of instructions provided today and declined a print copy of patient instruction materials.   Telephone follow up appointment  with care management team member scheduled for:02/11/19

## 2019-01-21 NOTE — Chronic Care Management (AMB) (Signed)
   Care Management    Clinical Social Work Follow Up Note  01/21/2019 Name: Brianna Brown MRN: [id number] DOB: Sep 25, 1975  Brianna Brown is a 43 y.o. year old female who is a primary care patient of Brianna Brown. The CCM team was consulted for assistance with Mental Health Counseling and Resources.   Review of patient status, including review of consultants reports, other relevant assessments, and collaboration with appropriate care team members and the patient's provider was performed as part of comprehensive patient evaluation and provision of chronic care management services.    Advanced Directives Status: <no information> See Care Plan for related entries.   Outpatient Encounter Medications as of 01/21/2019  Medication Sig  . albuterol (PROVENTIL HFA;VENTOLIN HFA) 108 (90 Base) MCG/ACT inhaler Inhale 2 puffs into the lungs every 4 (four) hours as needed for wheezing or shortness of breath. (Patient not taking: Reported on 08/17/2018)  . levothyroxine (SYNTHROID) 25 MCG tablet TAKE 1/2 TABLET(12.5 MCG) BY MOUTH DAILY BEFORE BREAKFAST  . sertraline (ZOLOFT) 100 MG tablet TAKE 1 AND 1/2 TABLETS(150 MG) BY MOUTH DAILY   No facility-administered encounter medications on file as of 01/21/2019.      Goals Addressed            This Visit's Progress   . "I want to manage my depression better" (pt-stated)       Current Barriers:  Marland Kitchen Mental Health Concerns   Clinical Social Work Clinical Goal(s):  Marland Kitchen Over the next 90 days, patient will work with this Education officer, museum  to address needs related to management of depressive symptoms  Interventions: . Patient interviewed and appropriate assessments performed . Continued to provide mental health counseling and support regarding issues related to work conflicts that brings on increased stress . Continued to discussed the importance of maintaining boundaries and increasing communication to address concerns, . Reinforced need to  adjust her perspective and to remember her ultimate goal  . Discussed plans with patient for ongoing care management follow up and provided patient with direct contact information for care management team . Encouraged patient's continued development of positive self care strategies to add balance to her life  Patient Self Care Activities:  . Self administers medications as prescribed . Attends all scheduled provider appointments . Performs ADL's independently . Performs IADL's independently  Please see past updates related to this goal by clicking on the "Past Updates" button in the selected goal          Follow Up Plan: SW will follow up with patient by phone over the next 3 weeks    [place], [place] Worker  [place] [facility] [phone]

## 2019-02-11 ENCOUNTER — Encounter: Payer: Self-pay | Admitting: *Deleted

## 2019-02-11 ENCOUNTER — Ambulatory Visit: Payer: Self-pay | Admitting: *Deleted

## 2019-02-11 ENCOUNTER — Telehealth: Payer: Self-pay | Admitting: *Deleted

## 2019-02-11 NOTE — Progress Notes (Signed)
This encounter was created in error - please disregard.

## 2019-02-11 NOTE — Chronic Care Management (AMB) (Signed)
    Care Management   Unsuccessful Call Note 02/11/2019 Name: Brianna Brown MRN: [id number] DOB: 30-Jun-1975  Patient  is a 43 year old female who sees Carles Collet PA-C for primary care. Carles Collet PA-C asked the CCM team to consult the patient for Mental Health Counseling and Resources.     This social worker was unable to reach patient via telephone today for follow up call.  Patient's  Voicemail was full, no message able to be left. (unsuccessful outreach #1).   Plan: Will follow-up within 7 business days via telephone.      Elliot Gurney, [place] Worker  [facility] [phone]

## 2019-02-25 ENCOUNTER — Telehealth: Payer: Self-pay

## 2019-02-25 ENCOUNTER — Ambulatory Visit: Payer: Self-pay | Admitting: *Deleted

## 2019-02-25 NOTE — Chronic Care Management (AMB) (Signed)
   Care Management   Unsuccessful Call Note 02/25/2019 Name: Brianna Brown MRN: [id number] DOB: Dec 19, 1975  Patient is a 43 year old female who sees Carles Collet, [place] for primary care. Carles Collet PA-C asked the CCM team to consult the patient for Mental Health Counseling and Resources.     This social worker was unable to reach patient via telephone today for follow up call. I have left HIPAA compliant voicemail asking patient to return my call. (unsuccessful outreach #2).   Plan: Will follow-up within 7-14 business days via telephone.      Elliot Gurney, [place] Worker  [facility] [phone]

## 2019-03-11 ENCOUNTER — Other Ambulatory Visit: Payer: Self-pay | Admitting: Physician Assistant

## 2019-03-11 DIAGNOSIS — E039 Hypothyroidism, unspecified: Secondary | ICD-10-CM

## 2019-03-12 NOTE — Telephone Encounter (Signed)
Requested medication (s) are due for refill today: yes  Requested medication (s) are on the active medication list: yes  Last refill:  12/10/2018  Future visit scheduled: yes  Notes to clinic:  hypothyroid agents failed. Needs TSH lab.  Requested Prescriptions  Pending Prescriptions Disp Refills   levothyroxine (SYNTHROID) 25 MCG tablet [Pharmacy Med Name: LEVOTHYROXINE 0.025MG  ( ) TAB] 45 tablet 0    Sig: TAKE 1/2 TABLET(12.5 MCG) BY MOUTH DAILY BEFORE BREAKFAST      Endocrinology:  Hypothyroid Agents Failed - 03/11/2019  3:34 AM      Failed - TSH needs to be rechecked within 3 months after an abnormal result. Refill until TSH is due.      Failed - TSH in normal range and within 360 days    TSH  Date Value Ref Range Status  03/12/2017 1.590 0.450 - 4.500 uIU/mL Final          Passed - Valid encounter within last 12 months    Recent Outpatient Visits           2 months ago Hypothyroidism, unspecified type   [facility] [place], Ricki Rodriguez M, PA-C   6 months ago Hypothyroidism, unspecified type   [facility] Osvaldo Angst M, [place]   11 months ago Fever, unspecified fever cause   [facility] [place], [place], [place]   1 year ago Depression, unspecified depression type   [facility] [place], [place], [place]   1 year ago Chronic bilateral low back pain, with sciatica presence unspecified   [facility] [place], [place], [place]

## 2019-03-18 ENCOUNTER — Ambulatory Visit: Payer: Self-pay | Admitting: *Deleted

## 2019-03-18 NOTE — Chronic Care Management (AMB) (Signed)
   Care Management   Social Work Note  03/18/2019 Name: Brianna Brown MRN: [id number] DOB: [birth year]  Brianna Brown is a 43 y.o. year old female who sees Trey Sailors, [place] for primary care. The CCM team was consulted for assistance with Mental Health Counseling and Resources.   Previous missed appointment discussed. Need to take time away from calls discussed. Patient states that she had been busy at work but would like to resume calls. Next appointment scheduled for 04/01/19 at 10am. Outpatient Encounter Medications as of 03/18/2019  Medication Sig  . albuterol (PROVENTIL HFA;VENTOLIN HFA) 108 (90 Base) MCG/ACT inhaler Inhale 2 puffs into the lungs every 4 (four) hours as needed for wheezing or shortness of breath. (Patient not taking: Reported on 08/17/2018)  . levothyroxine (SYNTHROID) 25 MCG tablet TAKE 1/2 TABLET(12.5 MCG) BY MOUTH DAILY BEFORE BREAKFAST  . sertraline (ZOLOFT) 100 MG tablet TAKE 1 AND 1/2 TABLETS(150 MG) BY MOUTH DAILY   No facility-administered encounter medications on file as of 03/18/2019.    Goals Addressed   None     Follow Up Plan: Appointment scheduled for SW follow up with client by phone on: 04/01/19   Verna Czech, LCSW Clinical Social Worker  [facility] Care Management [phone]

## 2019-04-01 ENCOUNTER — Telehealth: Payer: Self-pay

## 2019-04-01 ENCOUNTER — Ambulatory Visit: Payer: Self-pay | Admitting: *Deleted

## 2019-04-01 DIAGNOSIS — Z3490 Encounter for supervision of normal pregnancy, unspecified, unspecified trimester: Secondary | ICD-10-CM

## 2019-04-01 DIAGNOSIS — F32A Depression, unspecified: Secondary | ICD-10-CM

## 2019-04-01 DIAGNOSIS — F329 Major depressive disorder, single episode, unspecified: Secondary | ICD-10-CM

## 2019-04-01 NOTE — Chronic Care Management (AMB) (Signed)
   Care Management    Clinical Social Work Follow Up Note  04/01/2019 Name: Brianna Brown MRN: [id number] DOB: Nov 11, 1975  Brianna Brown is a 44 y.o. year old female who is a primary care patient of Maryella Shivers. The CCM team was consulted for assistance with Mental Health Counseling and Resources.   Review of patient status, including review of consultants reports, other relevant assessments, and collaboration with appropriate care team members and the patient's provider was performed as part of comprehensive patient evaluation and provision of chronic care management services.    Advanced Directives Status: <no information> See Care Plan for related entries.   Outpatient Encounter Medications as of 04/01/2019  Medication Sig  . albuterol (PROVENTIL HFA;VENTOLIN HFA) 108 (90 Base) MCG/ACT inhaler Inhale 2 puffs into the lungs every 4 (four) hours as needed for wheezing or shortness of breath. (Patient not taking: Reported on 08/17/2018)  . levothyroxine (SYNTHROID) 25 MCG tablet TAKE 1/2 TABLET(12.5 MCG) BY MOUTH DAILY BEFORE BREAKFAST  . sertraline (ZOLOFT) 100 MG tablet TAKE 1 AND 1/2 TABLETS(150 MG) BY MOUTH DAILY   No facility-administered encounter medications on file as of 04/01/2019.     Goals Addressed            This Visit's Progress   . "I want to manage my depression better" (pt-stated)       Current Barriers:  Marland Kitchen Mental Health Concerns   Clinical Social Work Clinical Goal(s):  Marland Kitchen Over the next 90 days, patient will work with this Child psychotherapist  to address needs related to management of depressive symptoms  Interventions: . Continued to provide mental health counseling and support regarding issues related to work conflicts that brings on increased stress . Continued to discussed the importance of maintaining boundaries and increasing communication to address concerns, . Reinforced need to adjust her perspective , manage her expectations and to remember to  stay true to herself . Provided positive reinforcement for positive coping strategies used when dealing with conflict at work and with family . Discussed plans with patient for ongoing care management follow up and provided patient with direct contact information for care management team . Encouraged patient's continued development of positive self care strategies to add balance to her life  Patient Self Care Activities:  . Self administers medications as prescribed . Attends all scheduled provider appointments . Performs ADL's independently . Performs IADL's independently  Please see past updates related to this goal by clicking on the "Past Updates" button in the selected goal          Follow Up Plan: SW will follow up with patient by phone over the next 2 weeks    Dmarius Reeder, LCSW Clinical Social Worker  [facility] Care [facility] [phone]

## 2019-04-01 NOTE — Patient Instructions (Signed)
Thank you allowing the Chronic Care Management Team to be a part of your care! It was a pleasure speaking with you today!  1. Please call this social worker with any questions or concerns regarding your mental health needs  CCM (Chronic Care Management) Team   Juanell Fairly RN, BSN Nurse Care Coordinator  [phone]  Karalee Height PharmD  Clinical Pharmacist  [phone]   Aracelys Glade [address], LCSW Clinical Social Worker [phone]  Goals Addressed            This Visit's Progress   . "I want to manage my depression better" (pt-stated)       Current Barriers:  Marland Kitchen Mental Health Concerns   Clinical Social Work Clinical Goal(s):  Marland Kitchen Over the next 90 days, patient will work with this Child psychotherapist  to address needs related to management of depressive symptoms  Interventions: . Continued to provide mental health counseling and support regarding issues related to work conflicts that brings on increased stress . Continued to discussed the importance of maintaining boundaries and increasing communication to address concerns, . Reinforced need to adjust her perspective , manage her expectations and to remember to stay true to herself . Provided positive reinforcement for positive coping strategies used when dealing with conflict at work and with family . Discussed plans with patient for ongoing care management follow up and provided patient with direct contact information for care management team . Encouraged patient's continued development of positive self care strategies to add balance to her life  Patient Self Care Activities:  . Self administers medications as prescribed . Attends all scheduled provider appointments . Performs ADL's independently . Performs IADL's independently  Please see past updates related to this goal by clicking on the "Past Updates" button in the selected goal          The patient verbalized understanding of instructions provided today and  declined a print copy of patient instruction materials.   Telephone follow up appointment with care management team member scheduled for: 04/15/19

## 2019-04-01 NOTE — Telephone Encounter (Signed)
Copied from CRM [id number]. Topic: Referral - Request for Referral >> Apr 01, 2019  1:32 PM Randol Kern wrote: Has patient seen PCP for this complaint? Yes.   *If NO, is insurance requiring patient see PCP for this issue before PCP can refer them? Referral for which specialty: OBGYN Preferred provider/office: Phineas Real at [facility]  Reason for referral: For prenatal care

## 2019-04-04 NOTE — Telephone Encounter (Signed)
I was going to put in for referral, just want to double check with you that I can proceed with referral order. KW

## 2019-04-04 NOTE — Addendum Note (Signed)
Addended by: Fonda Kinder on: 04/04/2019 04:57 PM   Modules accepted: Orders

## 2019-04-04 NOTE — Telephone Encounter (Signed)
OK to place referral

## 2019-04-12 NOTE — Telephone Encounter (Signed)
Spoke with Maye Hides via secure chat and she was getting in touch with the patient insurance company. Maralyn Sago stated that the [facility] office schedule the appointment for the patient.

## 2019-04-12 NOTE — Telephone Encounter (Signed)
Please advise 

## 2019-04-12 NOTE — Telephone Encounter (Signed)
Patient states referral was never sent.  Patient has an appt tomorrow. Office is stating they do not have referral.   Call back [phone]

## 2019-04-21 ENCOUNTER — Other Ambulatory Visit: Payer: Self-pay | Admitting: Family Medicine

## 2019-04-21 DIAGNOSIS — F329 Major depressive disorder, single episode, unspecified: Secondary | ICD-10-CM

## 2019-04-21 DIAGNOSIS — F32A Depression, unspecified: Secondary | ICD-10-CM

## 2019-04-22 ENCOUNTER — Ambulatory Visit: Payer: Self-pay | Admitting: *Deleted

## 2019-04-22 ENCOUNTER — Telehealth: Payer: Self-pay

## 2019-04-22 NOTE — Chronic Care Management (AMB) (Signed)
    Care Management   Unsuccessful Call Note 04/22/2019 Name: Brianna Brown MRN: [id number] DOB: 09-Nov-1975  Patient is a 44 year old female who sees Osvaldo Angst, [place] for primary care. Osvaldo Angst PA-C asked the CCM team to consult the patient for Mental Health Counseling and Resources.     This social worker was unable to reach patient via telephone today for follow up call. I have left HIPAA compliant voicemail asking patient to return my call. (unsuccessful outreach #1).   Plan: Will follow-up within 7 business days via telephone.      Verna Czech, LCSW Clinical Social Worker  [facility] Care Management [phone]

## 2019-04-22 NOTE — Chronic Care Management (AMB) (Signed)
   Care Management   Social Work Note  04/22/2019 Name: Brianna Brown MRN: [id number] DOB: 1975/03/27  Brianna Brown is a 44 y.o. year old female who sees Trey Sailors, [place] for primary care. The CCM team was consulted for assistance with Mental Health Counseling and Resources.   Phone call from patient to re-schedule appointment with this social worker. Appointment rescheduled for 05/06/19.    Outpatient Encounter Medications as of 04/22/2019  Medication Sig  . albuterol (PROVENTIL HFA;VENTOLIN HFA) 108 (90 Base) MCG/ACT inhaler Inhale 2 puffs into the lungs every 4 (four) hours as needed for wheezing or shortness of breath. (Patient not taking: Reported on 08/17/2018)  . levothyroxine (SYNTHROID) 25 MCG tablet TAKE 1/2 TABLET(12.5 MCG) BY MOUTH DAILY BEFORE BREAKFAST  . sertraline (ZOLOFT) 100 MG tablet TAKE 1 AND 1/2 TABLETS(150 MG) BY MOUTH DAILY   No facility-administered encounter medications on file as of 04/22/2019.    Goals Addressed   None     Follow Up Plan: Appointment scheduled for SW to meet with client in provider office on: 05/06/19   Verna Czech, LCSW Clinical Social Worker  [facility] Care Management [phone]

## 2019-04-27 ENCOUNTER — Telehealth: Payer: Self-pay

## 2019-05-06 ENCOUNTER — Ambulatory Visit: Payer: Self-pay | Admitting: *Deleted

## 2019-05-06 NOTE — Chronic Care Management (AMB) (Signed)
   Care Management   Social Work Note  05/06/2019 Name: Brianna Brown MRN: [id number] DOB: 1975-04-09  Brianna Brown is a 44 y.o. year old female who sees Trey Sailors, [place] for primary care. The CCM team was consulted for assistance with Mental Health Counseling and Resources.   Patient contacted for scheduled appointment, however due to a death in the family patient requested that the appointment be re-scheduled. Appointment re-scheduled for 05/13/19  SDOH (Social Determinants of Health) assessments performed: No     Outpatient Encounter Medications as of 05/06/2019  Medication Sig  . albuterol (PROVENTIL HFA;VENTOLIN HFA) 108 (90 Base) MCG/ACT inhaler Inhale 2 puffs into the lungs every 4 (four) hours as needed for wheezing or shortness of breath. (Patient not taking: Reported on 08/17/2018)  . levothyroxine (SYNTHROID) 25 MCG tablet TAKE 1/2 TABLET(12.5 MCG) BY MOUTH DAILY BEFORE BREAKFAST  . sertraline (ZOLOFT) 100 MG tablet TAKE 1 AND 1/2 TABLETS(150 MG) BY MOUTH DAILY   No facility-administered encounter medications on file as of 05/06/2019.    Goals Addressed   None     Follow Up Plan: Appointment scheduled for SW follow up with client by phone on: 05/13/19.   Verna Czech, LCSW Clinical Social Worker  [facility] Care [facility] [phone]

## 2019-05-12 LAB — OB RESULTS CONSOLE HIV ANTIBODY (ROUTINE TESTING): HIV: NONREACTIVE

## 2019-05-12 LAB — OB RESULTS CONSOLE VARICELLA ZOSTER ANTIBODY, IGG: Varicella: IMMUNE

## 2019-05-12 LAB — OB RESULTS CONSOLE GC/CHLAMYDIA
Chlamydia: NEGATIVE
Gonorrhea: NEGATIVE

## 2019-05-12 LAB — OB RESULTS CONSOLE RUBELLA ANTIBODY, IGM: Rubella: IMMUNE

## 2019-05-12 LAB — OB RESULTS CONSOLE HEPATITIS B SURFACE ANTIGEN: Hepatitis B Surface Ag: NEGATIVE

## 2019-05-12 LAB — OB RESULTS CONSOLE RPR: RPR: NONREACTIVE

## 2019-05-13 ENCOUNTER — Ambulatory Visit: Payer: Self-pay | Admitting: *Deleted

## 2019-05-13 DIAGNOSIS — F329 Major depressive disorder, single episode, unspecified: Secondary | ICD-10-CM

## 2019-05-13 DIAGNOSIS — F32A Depression, unspecified: Secondary | ICD-10-CM

## 2019-05-13 NOTE — Patient Instructions (Signed)
Thank you allowing the Chronic Care Management Team to be a part of your care! It was a pleasure speaking with you today!  1. Please call this social worker with any questions or concerns regarding your mental health needs  CCM (Chronic Care Management) Team   Juanell Fairly RN, BSN Nurse Care Coordinator  [phone]  Karalee Height PharmD  Clinical Pharmacist  [phone]   Jazmen Lindenbaum [address] LCSW Clinical Social Worker [phone]  Goals Addressed            This Visit's Progress   . "I want to manage my depression better" (pt-stated)       Current Barriers:  Marland Kitchen Mental Health Concerns   Clinical Social Work Clinical Goal(s):  Marland Kitchen Over the next 90 days, patient will work with this Child psychotherapist  to address needs related to management of depressive symptoms  Interventions: . Continued to provide mental health counseling and support regarding issues related to work conflicts that brings on increased stress . Continued to discussed the importance of maintaining boundaries and increasing communication to address concerns, . Reinforced need to manage her expectations, live here own truth and place her focus on her supportive network of family and friends . Provided positive reinforcement for positive coping strategies used when dealing with conflict at work and with family . Discussed plans with patient for ongoing care management follow up and provided patient with direct contact information for care management team . Encouraged patient's continued development of positive self care strategies  Patient Self Care Activities:  . Self administers medications as prescribed . Attends all scheduled provider appointments . Performs ADL's independently . Performs IADL's independently  Please see past updates related to this goal by clicking on the "Past Updates" button in the selected goal          The patient verbalized understanding of instructions provided today and  declined a print copy of patient instruction materials.   Telephone follow up appointment with care management team member scheduled for: 05/27/19

## 2019-05-13 NOTE — Chronic Care Management (AMB) (Signed)
   Care Management    Clinical Social Work Follow Up Note  05/13/2019 Name: Brianna Brown MRN: [id number] DOB: [birth year]  Brianna Brown is a 44 y.o. year old female who is a primary care patient of Maryella Shivers. The CCM team was consulted for assistance with Mental Health Counseling and Resources.   Review of patient status, including review of consultants reports, other relevant assessments, and collaboration with appropriate care team members and the patient's provider was performed as part of comprehensive patient evaluation and provision of chronic care management services.    SDOH (Social Determinants of Health) assessments performed: Yes    Outpatient Encounter Medications as of 05/13/2019  Medication Sig  . albuterol (PROVENTIL HFA;VENTOLIN HFA) 108 (90 Base) MCG/ACT inhaler Inhale 2 puffs into the lungs every 4 (four) hours as needed for wheezing or shortness of breath. (Patient not taking: Reported on 08/17/2018)  . levothyroxine (SYNTHROID) 25 MCG tablet TAKE 1/2 TABLET(12.5 MCG) BY MOUTH DAILY BEFORE BREAKFAST  . sertraline (ZOLOFT) 100 MG tablet TAKE 1 AND 1/2 TABLETS(150 MG) BY MOUTH DAILY   No facility-administered encounter medications on file as of 05/13/2019.     Goals Addressed            This Visit's Progress   . "I want to manage my depression better" (pt-stated)       Current Barriers:  Marland Kitchen Mental Health Concerns   Clinical Social Work Clinical Goal(s):  Marland Kitchen Over the next 90 days, patient will work with this Child psychotherapist  to address needs related to management of depressive symptoms  Interventions: . Continued to provide mental health counseling and support regarding issues related to work conflicts that brings on increased stress . Continued to discussed the importance of maintaining boundaries and increasing communication to address concerns, . Reinforced need to manage her expectations, live here own truth and place her focus on her supportive  network of family and friends . Provided positive reinforcement for positive coping strategies used when dealing with conflict at work and with family . Discussed plans with patient for ongoing care management follow up and provided patient with direct contact information for care management team . Encouraged patient's continued development of positive self care strategies  Patient Self Care Activities:  . Self administers medications as prescribed . Attends all scheduled provider appointments . Performs ADL's independently . Performs IADL's independently  Please see past updates related to this goal by clicking on the "Past Updates" button in the selected goal          Follow Up Plan: Appointment scheduled for SW follow up with client by phone on:  05/27/19    Verna Czech, LCSW Clinical Social Worker  [facility] Care [facility] [phone]

## 2019-05-18 ENCOUNTER — Other Ambulatory Visit: Payer: Self-pay

## 2019-05-18 DIAGNOSIS — Z3689 Encounter for other specified antenatal screening: Secondary | ICD-10-CM

## 2019-05-27 ENCOUNTER — Telehealth: Payer: Self-pay | Admitting: *Deleted

## 2019-05-27 ENCOUNTER — Ambulatory Visit: Payer: Self-pay | Admitting: *Deleted

## 2019-05-27 NOTE — Chronic Care Management (AMB) (Signed)
    Care Management   Unsuccessful Call Note 05/27/2019 Name: Brianna Brown MRN: [id number] DOB: [birth year]  Patient  is a 44 year old female who sees Osvaldo Angst, [place] for primary care. Osvaldo Angst, PA-C asked the CCM team to consult the patient for Mental Health Counseling and Resources.      This Child psychotherapist sas unable to reach patient via telephone today for our regularly scheduled appointment. I have left HIPAA compliant voicemail asking patient to return my call. (unsuccessful outreach #1).   Plan: Will follow-up within 7-10 business days via telephone.      Verna Czech, LCSW Clinical Social Worker  [facility] [phone]

## 2019-06-03 ENCOUNTER — Ambulatory Visit: Payer: Self-pay | Admitting: *Deleted

## 2019-06-03 ENCOUNTER — Telehealth: Payer: Self-pay | Admitting: *Deleted

## 2019-06-03 NOTE — Chronic Care Management (AMB) (Signed)
   Care Management   Unsuccessful Call Note 06/03/2019 Name: Brianna Brown MRN: [id number] DOB: 05-24-1975  Patient  is a 44 year old female who sees Osvaldo Angst PA-C for primary care. Osvaldo Angst, PA-C asked the CCM team to consult the patient for Mental Health Counseling and Resources.      This social worker was unable to reach patient via telephone today for bi-weekly follow up call. I have left HIPAA compliant voicemail asking patient to return my call. (unsuccessful outreach #2).   Plan: Will follow-up within 7-14 business days via telephone.      Verna Czech, LCSW Clinical Social Worker  [facility] [phone]

## 2019-06-10 ENCOUNTER — Other Ambulatory Visit: Payer: Self-pay | Admitting: Physician Assistant

## 2019-06-10 DIAGNOSIS — E039 Hypothyroidism, unspecified: Secondary | ICD-10-CM

## 2019-06-17 ENCOUNTER — Ambulatory Visit: Payer: Self-pay | Admitting: *Deleted

## 2019-06-17 ENCOUNTER — Telehealth: Payer: Self-pay | Admitting: *Deleted

## 2019-06-17 NOTE — Chronic Care Management (AMB) (Signed)
    Care Management   Unsuccessful Call Note 06/17/2019 Name: Brianna Brown MRN: [id number] DOB: 1976/02/22  Patient is a 44 year old female who sees Osvaldo Angst, [place] for primary care. Osvaldo Angst, PA-C asked the CCM team to consult the patient for Mental Health Counseling and Resources.      This social worker was unable to reach patient via telephone today for follow up phone call. I have left HIPAA compliant voicemail asking patient to return my call. (unsuccessful outreach #3).   Plan: This Child psychotherapist will not make any additional outreach calls to patient, however will be happy to re-engage patient upon her return call.      Verna Czech, LCSW Clinical Social Worker  [facility] [phone]

## 2019-06-23 ENCOUNTER — Ambulatory Visit

## 2019-06-24 ENCOUNTER — Telehealth: Payer: Self-pay | Admitting: Physician Assistant

## 2019-06-24 NOTE — Telephone Encounter (Signed)
Patients call was returned and patient was advised that she will have to reach out to the [facility] doctor. Patient states she will contact Dr. Dalbert Garnet office.

## 2019-06-24 NOTE — Telephone Encounter (Signed)
Patient is calling to request prescription for [facility] that was originally written by OB/GYN Dr. Dalbert Garnet to be released from [facility] to [facility]. [facility] advised that the patient's provider needed to do this. Patient is [redacted] weeks pregnant with a GIRL. Please advise CB- [phone]

## 2019-06-29 ENCOUNTER — Emergency Department
Admission: EM | Admit: 2019-06-29 | Discharge: 2019-06-29 | Disposition: A | Discharge status: Home / Self Care | Attending: Emergency Medicine | Admitting: Emergency Medicine

## 2019-06-29 ENCOUNTER — Encounter: Payer: Self-pay | Admitting: Emergency Medicine

## 2019-06-29 ENCOUNTER — Emergency Department

## 2019-06-29 ENCOUNTER — Other Ambulatory Visit: Payer: Self-pay

## 2019-06-29 DIAGNOSIS — E039 Hypothyroidism, unspecified: Secondary | ICD-10-CM | POA: Diagnosis not present

## 2019-06-29 DIAGNOSIS — R1032 Left lower quadrant pain: Secondary | ICD-10-CM | POA: Diagnosis present

## 2019-06-29 DIAGNOSIS — R102 Pelvic and perineal pain: Secondary | ICD-10-CM | POA: Insufficient documentation

## 2019-06-29 LAB — CBC
HCT: 35.5 % — ABNORMAL LOW (ref 36.0–46.0)
Hemoglobin: 12.2 g/dL (ref 12.0–15.0)
MCH: 30.7 pg (ref 26.0–34.0)
MCHC: 34.4 g/dL (ref 30.0–36.0)
MCV: 89.4 fL (ref 80.0–100.0)
Platelets: 207 10*3/uL (ref 150–400)
RBC: 3.97 MIL/uL (ref 3.87–5.11)
RDW: 13 % (ref 11.5–15.5)
WBC: 8.7 10*3/uL (ref 4.0–10.5)
nRBC: 0 % (ref 0.0–0.2)

## 2019-06-29 LAB — COMPREHENSIVE METABOLIC PANEL
ALT: 11 U/L (ref 0–44)
AST: 15 U/L (ref 15–41)
Albumin: 3.3 g/dL — ABNORMAL LOW (ref 3.5–5.0)
Alkaline Phosphatase: 46 U/L (ref 38–126)
Anion gap: 6 (ref 5–15)
BUN: 6 mg/dL (ref 6–20)
CO2: 24 mmol/L (ref 22–32)
Calcium: 8.5 mg/dL — ABNORMAL LOW (ref 8.9–10.3)
Chloride: 106 mmol/L (ref 98–111)
Creatinine, Ser: 0.64 mg/dL (ref 0.44–1.00)
GFR calc Af Amer: >60 mL/min (ref >60)
GFR calc non Af Amer: >60 mL/min (ref >60)
Glucose, Bld: 194 mg/dL — ABNORMAL HIGH (ref 70–99)
Potassium: 3.3 mmol/L — ABNORMAL LOW (ref 3.5–5.1)
Sodium: 136 mmol/L (ref 135–145)
Total Bilirubin: 0.4 mg/dL (ref 0.3–1.2)
Total Protein: 6.1 g/dL — ABNORMAL LOW (ref 6.5–8.1)

## 2019-06-29 LAB — URINALYSIS, COMPLETE (UACMP) WITH MICROSCOPIC
Bilirubin Urine: NEGATIVE
Glucose, UA: 150 mg/dL — AB
Hgb urine dipstick: NEGATIVE
Ketones, ur: NEGATIVE mg/dL
Leukocytes,Ua: NEGATIVE
Nitrite: NEGATIVE
Protein, ur: NEGATIVE mg/dL
Specific Gravity, Urine: 1.021 (ref 1.005–1.030)
pH: 6 (ref 5.0–8.0)

## 2019-06-29 LAB — HCG, QUANTITATIVE, PREGNANCY: hCG, Beta Chain, Quant, S: 24441 m[IU]/mL — ABNORMAL HIGH (ref <5)

## 2019-06-29 LAB — LIPASE, BLOOD: Lipase: 30 U/L (ref 11–51)

## 2019-06-29 MED ORDER — CEPHALEXIN 500 MG PO CAPS
500.0000 mg | ORAL_CAPSULE | Freq: Once | ORAL | Status: AC
  Administered 2019-06-29: 500 mg via ORAL
  Filled 2019-06-29: qty 1

## 2019-06-29 MED ORDER — CEPHALEXIN 500 MG PO CAPS: 500.0000 mg | ORAL_CAPSULE | Freq: Three times a day (TID) | ORAL | 0 refills | Status: AC

## 2019-06-29 NOTE — ED Triage Notes (Signed)
Pt reports is [redacted] weeks pregnant with her 4th child and started with lower abd cramping about a week ago. Pt states the pain is worse when she sits for extended periods of time. Denies vaginal bleeding. Pt sate that she does feel the baby move.

## 2019-06-29 NOTE — ED Notes (Signed)
See triage note, pt reports pain to LLQ, pt [redacted] weeks pregnant. Reports increased pain with sitting.  Reports feeling like possible constipation, had small BM today.  Denies vaginal bleeding Pt reports feeling baby move today

## 2019-06-29 NOTE — Discharge Instructions (Signed)
Increase intake of water at home. Decaffeinated coffee can be consumed in the morning. Increase daily walking or cycling. Tylenol can be taken for discomfort. MiraLAX can be taken if constipation persists. Increase intake of soluble sources of fiber such as apple and oatmeal.

## 2019-06-29 NOTE — ED Triage Notes (Signed)
[id number]

## 2019-06-29 NOTE — ED Provider Notes (Signed)
Emergency Department Provider Note  ____________________________________________  Time seen: Approximately 9:39 PM  I have reviewed the triage vital signs and the nursing notes.   HISTORY  Chief Complaint Abdominal Pain   Historian Patient     HPI Brianna Brown is a 44 y.o. female G4, P3 currently [redacted] weeks pregnant, presents to the emergency department with concern for left lower quadrant abdominal discomfort that is occurred intermittently over the past 24 hours.  Patient endorses a history of constipation and states that she feels like her current symptoms are similar to episodes of constipation in the past.  Last bowel movement was today.  She denies nausea, vomiting or diarrhea.  She states that she typically goes to the gym after dropping off her children and has not been able to go today.  She denies vaginal bleeding or gush of vaginal fluids.  No other alleviating measures have been attempted.   History reviewed. No pertinent past medical history.   Immunizations up to date:  Yes.     History reviewed. No pertinent past medical history.  Patient Active Problem List   Diagnosis Date Noted  . Exercise-induced asthma 02/26/2017  . Encounter for surveillance of contraceptive pills 02/26/2017  . Hypothyroidism 02/26/2017  . History of anorexia nervosa 02/26/2017    Past Surgical History:  Procedure Laterality Date  . CESAREAN SECTION     x's 3    Prior to Admission medications   Medication Sig Start Date End Date Taking? Authorizing Provider  albuterol (PROVENTIL HFA;VENTOLIN HFA) 108 (90 Base) MCG/ACT inhaler Inhale 2 puffs into the lungs every 4 (four) hours as needed for wheezing or shortness of breath. Patient not taking: Reported on 08/17/2018 02/26/17 04/05/18  Trey Sailors, PA-C  cephALEXin (KEFLEX) 500 MG capsule Take 1 capsule (500 mg total) by mouth 3 (three) times daily for 7 days. 06/29/19 07/06/19  Orvil Feil, PA-C  levothyroxine  (SYNTHROID) 25 MCG tablet TAKE 1/2 TABLET(12.5 MCG) BY MOUTH DAILY BEFORE BREAKFAST 06/10/19   Trey Sailors, PA-C  sertraline (ZOLOFT) 100 MG tablet TAKE 1 AND 1/2 TABLETS(150 MG) BY MOUTH DAILY 04/21/19   Trey Sailors, PA-C    Allergies Patient has no known allergies.  Family History  Problem Relation Age of Onset  . Hypertension Mother   . Heart attack Father   . Healthy Sister   . Healthy Brother   . CVA Brother 63  . Lymphoma Maternal Grandmother     Social History Social History   Tobacco Use  . Smoking status: Never Smoker  . Smokeless tobacco: Never Used  Substance Use Topics  . Alcohol use: Yes    Comment: Rarely   . Drug use: No     Review of Systems  Constitutional: No fever/chills Eyes:  No discharge ENT: No upper respiratory complaints. Respiratory: no cough. No SOB/ use of accessory muscles to breath Gastrointestinal: Patient has left lower abdominal discomfort. Musculoskeletal: Negative for musculoskeletal pain. Skin: Negative for rash, abrasions, lacerations, ecchymosis.   ____________________________________________   PHYSICAL EXAM:  VITAL SIGNS: ED Triage Vitals  Enc Vitals Group     BP 06/29/19 1358 (!) 98/58     Pulse Rate 06/29/19 1358 (!) 101     Resp 06/29/19 1358 18     Temp 06/29/19 1358 98.4 F (36.9 C)     Temp Source 06/29/19 1358 Oral     SpO2 06/29/19 1358 98 %     Weight 06/29/19 1358 128 lb (58.1 kg)  Height 06/29/19 1358 5\' 3"  (1.6 m)     Head Circumference --      Peak Flow --      Pain Score 06/29/19 1405 7     Pain Loc --      Pain Edu? --      Excl. in [place]? --      Constitutional: Alert and oriented. Well appearing and in no acute distress. Eyes: Conjunctivae are normal. PERRL. EOMI. Head: Atraumatic. Cardiovascular: Normal rate, regular rhythm. Normal S1 and S2.  Good peripheral circulation. Respiratory: Normal respiratory effort without tachypnea or retractions. Lungs CTAB. Good air entry to the bases  with no decreased or absent breath sounds Gastrointestinal: Bowel sounds x 4 quadrants. Soft and nontender to palpation. No guarding or rigidity. No distention. Musculoskeletal: Full range of motion to all extremities. No obvious deformities noted Neurologic:  Normal for age. No gross focal neurologic deficits are appreciated.  Skin:  Skin is warm, dry and intact. No rash noted. Psychiatric: Mood and affect are normal for age. Speech and behavior are normal.   ____________________________________________   LABS (all labs ordered are listed, but only abnormal results are displayed)  Labs Reviewed  COMPREHENSIVE METABOLIC PANEL - Abnormal; Notable for the following components:      Result Value   Potassium 3.3 (*)    Glucose, Bld 194 (*)    Calcium 8.5 (*)    Total Protein 6.1 (*)    Albumin 3.3 (*)    All other components within normal limits  CBC - Abnormal; Notable for the following components:   HCT 35.5 (*)    All other components within normal limits  HCG, QUANTITATIVE, PREGNANCY - Abnormal; Notable for the following components:   hCG, Beta Chain, Quant, S 24,441 (*)    All other components within normal limits  URINALYSIS, COMPLETE (UACMP) WITH MICROSCOPIC - Abnormal; Notable for the following components:   Color, Urine YELLOW (*)    APPearance HAZY (*)    Glucose, UA 150 (*)    Bacteria, UA FEW (*)    All other components within normal limits  URINE CULTURE  LIPASE, BLOOD   ____________________________________________  EKG   ____________________________________________  RADIOLOGY Unk Pinto, personally viewed and evaluated these images (plain radiographs) as part of my medical decision making, as well as reviewing the written report by the radiologist.  US OB Limited  Result Date: 06/29/2019 CLINICAL DATA:  Pelvic pain for 1 week, left lower quadrant pain, pregnant EXAM: LIMITED OBSTETRIC ULTRASOUND AN DOPPLER FINDINGS: Number of Fetuses: 1 Heart Rate:   148 bpm Movement: Yes Presentation: Cephalic Placental Location: Anterior Previa: No Amniotic Fluid (Subjective):  Within normal limits. BPD:  4.1cm 18w 3d MATERNAL FINDINGS: Cervix:  Appears closed. Uterus/Adnexae: Uterus is unremarkable. Right ovary measures 2.6 x 1.4 x 1.9 cm and the left ovary measures 3.2 x 1.6 by 2.3 cm Pulsed Doppler evaluation of both ovaries demonstrates normal low-resistance arterial and venous waveforms. Other findings No free fluid. IMPRESSION: 1. Single live intrauterine pregnancy as above, estimated age 27 weeks and 6 days. 2. Normal Doppler interrogation of the ovaries. This exam is performed on an emergent basis and does not comprehensively evaluate fetal size, dating, or anatomy; follow-up complete OB US should be considered if further fetal assessment is warranted. Electronically Signed   By: Randa Ngo M.D.   On: 06/29/2019 17:46   US PELVIC DOPPLER (TORSION R/O OR MASS ARTERIAL FLOW)  Result Date: 06/29/2019 CLINICAL DATA:  Pelvic pain  for 1 week, left lower quadrant pain, pregnant EXAM: LIMITED OBSTETRIC ULTRASOUND AN DOPPLER FINDINGS: Number of Fetuses: 1 Heart Rate:  148 bpm Movement: Yes Presentation: Cephalic Placental Location: Anterior Previa: No Amniotic Fluid (Subjective):  Within normal limits. BPD:  4.1cm 18w 3d MATERNAL FINDINGS: Cervix:  Appears closed. Uterus/Adnexae: Uterus is unremarkable. Right ovary measures 2.6 x 1.4 x 1.9 cm and the left ovary measures 3.2 x 1.6 by 2.3 cm Pulsed Doppler evaluation of both ovaries demonstrates normal low-resistance arterial and venous waveforms. Other findings No free fluid. IMPRESSION: 1. Single live intrauterine pregnancy as above, estimated age 35 weeks and 6 days. 2. Normal Doppler interrogation of the ovaries. This exam is performed on an emergent basis and does not comprehensively evaluate fetal size, dating, or anatomy; follow-up complete OB US should be considered if further fetal assessment is warranted.  Electronically Signed   By: Sharlet Salina M.D.   On: 06/29/2019 17:46    ____________________________________________    PROCEDURES  Procedure(s) performed:     Procedures     Medications  cephALEXin (KEFLEX) capsule 500 mg (500 mg Oral Given 06/29/19 1852)     ____________________________________________   INITIAL IMPRESSION / ASSESSMENT AND PLAN / ED COURSE  Pertinent labs & imaging results that were available during my care of the patient were reviewed by me and considered in my medical decision making (see chart for details).    Assessment and plan  Abdominal Discomfort:  44 year old female presents to the ED with left lower quadrant abdominal discomfort  for the past 24 hours.  Vital signs were reassuring at triage. On physical exam, abdomen was soft and non-tender without guarding.   Differential diagnosis includes constipation, ovarian cyst, ovarian torsion...  CBC was reassuring without leukocytosis.  Beta-hCG was appropriately elevated.  Lipase was within reference range.  Urinalysis revealed a few bacteria.  CMP indicated some very mild hypokalemia but was otherwise reassuring.  Pelvic ultrasound revealed a single viable intrauterine pregnancy at 18 weeks and 6 days.  I had a long conversation with patient regarding observing symptoms at home versus obtaining an MRI in the emergency department.  Patient states that she felt comfortable going home and was going to try increasing daily walking and MiraLAX to see if her discomfort improved.  She states that she has easy access to the ED and will return if her symptoms worsen.  She was discharged with Keflex given bacteriuria identified on urinalysis.  Return precautions were given to return with new or worsening symptoms.  All patient questions were answered.   ____________________________________________  FINAL CLINICAL IMPRESSION(S) / ED DIAGNOSES  Final diagnoses:  Pelvic pain      NEW MEDICATIONS  STARTED DURING THIS VISIT:  ED Discharge Orders         Ordered    cephALEXin (KEFLEX) 500 MG capsule  3 times daily     06/29/19 1850              This chart was dictated using voice recognition software/Dragon. Despite best efforts to proofread, errors can occur which can change the meaning. Any change was purely unintentional.      Gasper Lloyd 06/29/19 2144    Sharman Cheek, MD 06/30/19 [phone]

## 2019-07-01 LAB — URINE CULTURE
Culture: <10000 — AB
Special Requests: NORMAL

## 2019-07-25 ENCOUNTER — Other Ambulatory Visit: Payer: Self-pay | Admitting: Physician Assistant

## 2019-07-25 DIAGNOSIS — F32A Depression, unspecified: Secondary | ICD-10-CM

## 2019-07-25 NOTE — Telephone Encounter (Signed)
Courtesy refill  

## 2019-08-20 ENCOUNTER — Other Ambulatory Visit: Payer: Self-pay | Admitting: Physician Assistant

## 2019-08-20 DIAGNOSIS — F32A Depression, unspecified: Secondary | ICD-10-CM

## 2019-08-20 NOTE — Telephone Encounter (Signed)
Requested  medications are  due for refill today yes  Requested medications are on the active medication list yes  Last refill 5/17  Future visit scheduled no  Notes to clinic failed protocol due to no related visit within 6 months

## 2019-09-11 ENCOUNTER — Other Ambulatory Visit: Payer: Self-pay

## 2019-09-11 ENCOUNTER — Encounter: Payer: Self-pay | Admitting: Obstetrics and Gynecology

## 2019-09-11 ENCOUNTER — Observation Stay
Admission: EM | Admit: 2019-09-11 | Discharge: 2019-09-11 | Disposition: A | Discharge status: Home / Self Care | Attending: Certified Nurse Midwife | Admitting: Certified Nurse Midwife

## 2019-09-11 DIAGNOSIS — I959 Hypotension, unspecified: Secondary | ICD-10-CM | POA: Diagnosis not present

## 2019-09-11 DIAGNOSIS — B9689 Other specified bacterial agents as the cause of diseases classified elsewhere: Secondary | ICD-10-CM | POA: Diagnosis not present

## 2019-09-11 DIAGNOSIS — O26893 Other specified pregnancy related conditions, third trimester: Principal | ICD-10-CM | POA: Insufficient documentation

## 2019-09-11 DIAGNOSIS — Z3A29 29 weeks gestation of pregnancy: Secondary | ICD-10-CM | POA: Diagnosis not present

## 2019-09-11 DIAGNOSIS — R109 Unspecified abdominal pain: Secondary | ICD-10-CM | POA: Insufficient documentation

## 2019-09-11 DIAGNOSIS — O26899 Other specified pregnancy related conditions, unspecified trimester: Secondary | ICD-10-CM | POA: Diagnosis present

## 2019-09-11 DIAGNOSIS — N760 Acute vaginitis: Secondary | ICD-10-CM | POA: Insufficient documentation

## 2019-09-11 HISTORY — DX: Hypothyroidism, unspecified: E03.9

## 2019-09-11 HISTORY — DX: Unspecified asthma, uncomplicated: J45.909

## 2019-09-11 LAB — WET PREP, GENITAL
Sperm: NONE SEEN
Trich, Wet Prep: NONE SEEN
Yeast Wet Prep HPF POC: NONE SEEN

## 2019-09-11 LAB — RUPTURE OF MEMBRANE (ROM)PLUS: Rom Plus: NEGATIVE

## 2019-09-11 MED ORDER — LACTATED RINGERS IV BOLUS
1000.0000 mL | Freq: Once | INTRAVENOUS | Status: AC
  Administered 2019-09-11: 1000 mL via INTRAVENOUS

## 2019-09-11 MED ORDER — TERBUTALINE SULFATE 1 MG/ML IJ SOLN
INTRAMUSCULAR | Status: AC
  Filled 2019-09-11: qty 1

## 2019-09-11 MED ORDER — TERBUTALINE SULFATE 1 MG/ML IJ SOLN
0.2500 mg | Freq: Once | INTRAMUSCULAR | Status: AC
  Administered 2019-09-11: 0.25 mg via SUBCUTANEOUS

## 2019-09-11 MED ORDER — METRONIDAZOLE 500 MG PO TABS
500.0000 mg | ORAL_TABLET | Freq: Two times a day (BID) | ORAL | 0 refills | Status: AC
Start: 2019-09-11 — End: 2019-09-18

## 2019-09-11 NOTE — Discharge Summary (Signed)
Brianna Brown is a 44 y.o. female. She is at [redacted]w[redacted]d gestation. Patient's last menstrual period was 02/15/2019. Estimated Date of Delivery: 11/22/19  Prenatal care site: [facility]   Chief complaint: lower abdominal cramping Location: lower abdomen Onset/timing: yesterday morning Duration: intermittent throughout the day Quality: cramping Severity: mild to moderate Aggravating or alleviating conditions: none Associated signs/symptoms: increase in discharge at one point in the afternoon Context: Brianna Brown reports lower abdominal cramping that started yesterday morning. It has been coming on and off throughout the day, mild to moderate in intensity. Nothing in particular makes it better or worse. It has not kept her from doing her regular activities. She reports that she is not as well hydrated as she could be. She also reports one episode of increased vaginal discharge/possible leakage in the afternoon. She has not noticed any continued leakage since.   S: Resting comfortably.   She reports:  -active fetal movement -no leakage of fluid  -no vaginal bleeding  Maternal Medical History:   Past Medical History:  Diagnosis Date  . Asthma   . Hypothyroidism     Past Surgical History:  Procedure Laterality Date  . CESAREAN SECTION     x's 3    No Known Allergies  Prior to Admission medications   Medication Sig Start Date End Date Taking? Authorizing Provider  albuterol (PROVENTIL HFA;VENTOLIN HFA) 108 (90 Base) MCG/ACT inhaler Inhale 2 puffs into the lungs every 4 (four) hours as needed for wheezing or shortness of breath. Patient not taking: Reported on 08/17/2018 02/26/17 04/05/18  Trey Sailors, PA-C  levothyroxine (SYNTHROID) 25 MCG tablet TAKE 1/2 TABLET(12.5 MCG) BY MOUTH DAILY BEFORE BREAKFAST 06/10/19   Trey Sailors, PA-C  sertraline (ZOLOFT) 100 MG tablet TAKE 1 AND 1/2 TABLETS(150 MG) BY MOUTH DAILY 08/22/19   Trey Sailors, PA-C     Social  History: She  reports that she has never smoked. She has never used smokeless tobacco. She reports current alcohol use. She reports that she does not use drugs.  Family History: family history includes CVA (age of onset: 66) in her brother; Healthy in her brother and sister; Heart attack in her father; Hypertension in her mother; Lymphoma in her maternal grandmother.   Review of Systems: A full review of systems was performed and negative except as noted in the HPI.     O:  BP (!) 87/51   Pulse 95   Temp 98.6 F (37 C)   Resp 18   LMP 02/15/2019  Results for orders placed or performed during the hospital encounter of 09/11/19 (from the past 48 hour(s))  Wet prep, genital   Collection Time: 09/11/19  2:17 AM  Result Value Ref Range   Yeast Wet Prep HPF POC NONE SEEN NONE SEEN   Trich, Wet Prep NONE SEEN NONE SEEN   Clue Cells Wet Prep HPF POC PRESENT (A) NONE SEEN   WBC, Wet Prep HPF POC MODERATE (A) NONE SEEN   Sperm NONE SEEN   ROM Plus ([facility] only)   Collection Time: 09/11/19  2:17 AM  Result Value Ref Range   Rom Plus NEGATIVE      Constitutional: NAD, AAOx3  HE/ENT: extraocular movements grossly intact, moist mucous membranes CV: RRR PULM: normal respiratory effort, CTABL     Abd: gravid, non-tender, non-distended, soft      Ext: Non-tender, Nonedmeatous   Psych: mood appropriate, speech normal Pelvic: cervix closed/long/high middle/medium  NST:  Baseline: 140 Variability: moderate Accelerations: 15x15  present regularly Decelerations: present with episode of hypotension, then none Time: 2 hours Toco: initial occasional contraction, then none   A/P: 44 y.o. [redacted]w[redacted]d here for antenatal surveillance during pregnancy.  Principle diagnosis: cramping during pregnancy, bacterial vaginosis  Labor  Not present  Cervix closed  Cramping stopped with one dose of terbutaline and IV fluid bolus  Fetal Wellbeing  Reactive NST, reassuring for GA  Bacterial  vaginosis  Wet prep with evidence of BV. Will sent prescription to patient's pharmacy for metronidazole PO 500mg  BID x 7 days.   Hypotension  Patient became symptomatically hypotensive to the 50s/30s while laying flat on her back. Blood pressures improved with repositioning and IV fluid bolus, patient stated that she felt fine. Patient's baseline blood pressures in the office mostly 80s-90s/50s-60s.   D/c home stable, precautions reviewed, follow-up as scheduled.    09/11/2019 4:00 AM  ----- 11/12/2019, CNM Certified Nurse Midwife [facility], Department of OB/GYN [facility]

## 2019-09-11 NOTE — Discharge Instructions (Signed)
Abdominal Pain During Pregnancy  Belly (abdominal) pain is common during pregnancy. There are many possible causes. Most of the time, it is not a serious problem. Other times, it can be a sign that something is wrong with the pregnancy. Always tell your doctor if you have belly pain. Follow these instructions at home:  Do not have sex or put anything in your vagina until your pain goes away completely.  Get plenty of rest until your pain gets better.  Drink enough fluid to keep your pee (urine) pale yellow.  Take over-the-counter and prescription medicines only as told by your doctor.  Keep all follow-up visits as told by your doctor. This is important. Contact a doctor if:  Your pain continues or gets worse after resting.  You have lower belly pain that: ? Comes and goes at regular times. ? Spreads to your back. ? Feels like menstrual cramps.  You have pain or burning when you pee (urinate). Get help right away if:  You have a fever or chills.  You have vaginal bleeding.  You are leaking fluid from your vagina.  You are passing tissue from your vagina.  You throw up (vomit) for more than 24 hours.  You have watery poop (diarrhea) for more than 24 hours.  Your baby is moving less than usual.  You feel very weak or faint.  You have shortness of breath.  You have very bad pain in your upper belly. Summary  Belly (abdominal) pain is common during pregnancy. There are many possible causes.  If you have belly pain during pregnancy, tell your doctor right away.  Keep all follow-up visits as told by your doctor. This is important. This information is not intended to replace advice given to you by your health care provider. Make sure you discuss any questions you have with your health care provider. Document Revised: 06/14/2018 Document Reviewed: 05/29/2016 Elsevier Patient Education  2020 Elsevier Inc.  

## 2019-09-11 NOTE — OB Triage Note (Signed)
Pt is a G4P3 at 29.3w who presents after a day of mild lower uterine cramping on and off throughout the day. Pt states she some dental work done that made her tired but nothing out of the ordinary. A&O x 4 no acute distress. Good historian

## 2019-09-11 NOTE — OB Triage Note (Signed)
Discharge instructions provided and reviewed.  Rx sent to pharmacy.  Follow up care discussed.  Pt verbalizes understanding.

## 2019-09-12 ENCOUNTER — Other Ambulatory Visit: Payer: Self-pay | Admitting: Physician Assistant

## 2019-09-12 DIAGNOSIS — F32A Depression, unspecified: Secondary | ICD-10-CM

## 2019-09-13 NOTE — Telephone Encounter (Signed)
Requested medication (s) are due for refill today: no  Requested medication (s) are on the active medication list: yes  Last refill:  08/22/2019  Future visit scheduled: no  Notes to clinic: Patient given courtesy refill and no follow up schedule   Requested Prescriptions  Pending Prescriptions Disp Refills   sertraline (ZOLOFT) 100 MG tablet [Pharmacy Med Name: SERTRALINE 100MG  TABLETS] 30 tablet 0    Sig: TAKE 1 AND 1/2 TABLETS(150 MG) BY MOUTH DAILY      Psychiatry:  Antidepressants - SSRI Failed - 09/12/2019  3:36 AM      Failed - Valid encounter within last 6 months    Recent Outpatient Visits           9 months ago Hypothyroidism, unspecified type   [facility] [place], [place], PA-C   1 year ago Hypothyroidism, unspecified type   [facility], [place], PA-C   1 year ago Fever, unspecified fever cause   [facility] [place], [place], [place]   2 years ago Depression, unspecified depression type   [facility] [place], [place], [place]   2 years ago Chronic bilateral low back pain, with sciatica presence unspecified   [facility] [place], [place], [place]

## 2019-09-17 ENCOUNTER — Ambulatory Visit: Attending: Internal Medicine

## 2019-09-17 ENCOUNTER — Other Ambulatory Visit: Payer: Self-pay | Admitting: Physician Assistant

## 2019-09-17 DIAGNOSIS — Z23 Encounter for immunization: Secondary | ICD-10-CM

## 2019-09-17 DIAGNOSIS — E039 Hypothyroidism, unspecified: Secondary | ICD-10-CM

## 2019-09-17 NOTE — Telephone Encounter (Signed)
Requested  medications are  due for refill today yes  Requested medications are on the active medication list yes  Last refill 4/13  Last visit Oct 2020  Notes to clinic TSH was ordered Oct 2020 but does not appear to have been drawn. Failed protocol due labs not being within 1 year.

## 2019-09-17 NOTE — Progress Notes (Signed)
   Covid-19 Vaccination Clinic  Name:  Brianna Brown    MRN: [id number] DOB: 02/25/1976  09/17/2019  Ms. Clune was observed post Covid-19 immunization for 15 minutes without incident. She was provided with Vaccine Information Sheet and instruction to access the V-Safe system.   Ms. Morrish was instructed to call 911 with any severe reactions post vaccine: Marland Kitchen Difficulty breathing  . Swelling of face and throat  . A fast heartbeat  . A bad rash all over body  . Dizziness and weakness   Immunizations Administered    Name Date Dose VIS Date Route   Pfizer COVID-19 Vaccine 09/17/2019 11:26 AM 0.3 mL 05/04/2018 Intramuscular   Manufacturer: [employer], [employer]   Lot: [id number]   NDC: [address]-8

## 2019-09-26 ENCOUNTER — Encounter: Payer: Self-pay | Admitting: Physician Assistant

## 2019-09-26 ENCOUNTER — Ambulatory Visit (INDEPENDENT_AMBULATORY_CARE_PROVIDER_SITE_OTHER): Admitting: Physician Assistant

## 2019-09-26 ENCOUNTER — Other Ambulatory Visit: Payer: Self-pay

## 2019-09-26 DIAGNOSIS — E039 Hypothyroidism, unspecified: Secondary | ICD-10-CM

## 2019-09-26 DIAGNOSIS — F32A Depression, unspecified: Secondary | ICD-10-CM

## 2019-09-26 DIAGNOSIS — F329 Major depressive disorder, single episode, unspecified: Secondary | ICD-10-CM | POA: Diagnosis not present

## 2019-09-26 MED ORDER — SERTRALINE HCL 100 MG PO TABS: 100.0000 mg | ORAL_TABLET | Freq: Every day | ORAL | 3 refills | Status: DC

## 2019-09-26 MED ORDER — LEVOTHYROXINE SODIUM 25 MCG PO TABS: ORAL_TABLET | ORAL | 0 refills | Status: DC

## 2019-09-26 NOTE — Progress Notes (Signed)
Established patient visit   Patient: Brianna Brown   DOB: [birth year]   44 y.o. Female  MRN: [id number] Visit Date: 09/26/2019  Today's healthcare provider: Trey Sailors, PA-C   Chief Complaint  Patient presents with  . Hypothyroidism   Subjective    HPI  Hypothyroid, follow-up  Lab Results  Component Value Date   TSH 1.590 03/12/2017   TSH 1.50 10/31/2015   FREET4 0.91 03/12/2017   Wt Readings from Last 3 Encounters:  09/26/19 141 lb (64 kg)  06/29/19 128 lb (58.1 kg)  12/14/18 120 lb (54.4 kg)    She was last seen for hypothyroid 1 years ago.  Management since that visit includes no changes. She reports good compliance with treatment. She is not having side effects.   Symptoms: No change in energy level No constipation  No diarrhea No heat / cold intolerance  No nervousness No palpitations  No weight changes     Depression, Follow-up  She  was last seen for this 10 months ago. Changes made at last visit include continue zoloft. She is currently taking it at a dose of 100 mg QD   She reports excellent compliance with treatment. She is not having side effects.   She reports excellent tolerance of treatment. Current symptoms include: none She feels she is Unchanged since last visit.  Depression screen [facility] 2/9 12/14/2018 09/04/2018 08/17/2018  Decreased Interest 0 1 1  Down, Depressed, Hopeless 0 1 1  PHQ - 2 Score 0 2 2  Altered sleeping - 1 1  Tired, decreased energy - 1 1  Change in appetite - 1 1  Feeling bad or failure about yourself  - 1 1  Trouble concentrating - 0 0  Moving slowly or fidgety/restless - 0 0  Suicidal thoughts - 0 0  PHQ-9 Score - 6 6  Difficult doing work/chores - Somewhat difficult Somewhat difficult    -----------------------------------------------------------------------------------------      Medications: Outpatient Medications Prior to Visit  Medication Sig  . [DISCONTINUED] levothyroxine (SYNTHROID) 25 MCG  tablet TAKE 1/2 TABLET(12.5 MCG) BY MOUTH DAILY BEFORE BREAKFAST  . [DISCONTINUED] sertraline (ZOLOFT) 100 MG tablet TAKE 1 AND 1/2 TABLETS(150 MG) BY MOUTH DAILY  . albuterol (PROVENTIL HFA;VENTOLIN HFA) 108 (90 Base) MCG/ACT inhaler Inhale 2 puffs into the lungs every 4 (four) hours as needed for wheezing or shortness of breath. (Patient not taking: Reported on 08/17/2018)   No facility-administered medications prior to visit.    Review of Systems  Constitutional: Negative.   Respiratory: Negative.   Cardiovascular: Negative.   Musculoskeletal: Negative.   Neurological: Negative.       Objective    BP (!) 88/63 (BP Location: Right Arm)   Pulse 86   Temp 98.6 F (37 C)   Ht 5' 3.75" (1.619 m)   Wt 141 lb (64 kg)   LMP 02/15/2019   BMI 24.39 kg/m    Physical Exam Constitutional:      Appearance: Normal appearance.  Cardiovascular:     Rate and Rhythm: Normal rate and regular rhythm.     Pulses: Normal pulses.     Heart sounds: Normal heart sounds.  Pulmonary:     Effort: Pulmonary effort is normal.     Breath sounds: Normal breath sounds.  Skin:    General: Skin is warm and dry.  Neurological:     Mental Status: She is alert and oriented to person, place, and time. Mental status is at baseline.  Psychiatric:  Mood and Affect: Mood normal.        Behavior: Behavior normal.       No results found for any visits on 09/26/19.  Assessment & Plan    1. Hypothyroidism, unspecified type  Follow up 1 year.   - levothyroxine (SYNTHROID) 25 MCG tablet; Take 1/2 tablet by mouth daily.  Dispense: 45 tablet; Refill: 0  2. Depression, unspecified depression type  Follow up one year.  - sertraline (ZOLOFT) 100 MG tablet; Take 1 tablet (100 mg total) by mouth daily.  Dispense: 90 tablet; Refill: 3         I, Trey Sailors, PA-C, have reviewed all documentation for this visit. The documentation on 09/28/19 for the exam, diagnosis, procedures, and orders are  all accurate and complete.    Maryella Shivers  [facility] [phone] (phone) [phone] (fax)  [facility]

## 2019-09-30 ENCOUNTER — Other Ambulatory Visit: Payer: Self-pay

## 2019-09-30 ENCOUNTER — Observation Stay
Admission: EM | Admit: 2019-09-30 | Discharge: 2019-09-30 | Disposition: A | Discharge status: Home / Self Care | Attending: Obstetrics and Gynecology | Admitting: Obstetrics and Gynecology

## 2019-09-30 ENCOUNTER — Encounter: Payer: Self-pay | Admitting: Obstetrics and Gynecology

## 2019-09-30 DIAGNOSIS — O99513 Diseases of the respiratory system complicating pregnancy, third trimester: Secondary | ICD-10-CM | POA: Diagnosis not present

## 2019-09-30 DIAGNOSIS — O99283 Endocrine, nutritional and metabolic diseases complicating pregnancy, third trimester: Secondary | ICD-10-CM | POA: Insufficient documentation

## 2019-09-30 DIAGNOSIS — Z8249 Family history of ischemic heart disease and other diseases of the circulatory system: Secondary | ICD-10-CM | POA: Insufficient documentation

## 2019-09-30 DIAGNOSIS — O4703 False labor before 37 completed weeks of gestation, third trimester: Secondary | ICD-10-CM | POA: Diagnosis present

## 2019-09-30 DIAGNOSIS — O2343 Unspecified infection of urinary tract in pregnancy, third trimester: Secondary | ICD-10-CM | POA: Diagnosis not present

## 2019-09-30 DIAGNOSIS — J45909 Unspecified asthma, uncomplicated: Secondary | ICD-10-CM | POA: Diagnosis not present

## 2019-09-30 DIAGNOSIS — Z807 Family history of other malignant neoplasms of lymphoid, hematopoietic and related tissues: Secondary | ICD-10-CM | POA: Insufficient documentation

## 2019-09-30 DIAGNOSIS — O26893 Other specified pregnancy related conditions, third trimester: Principal | ICD-10-CM | POA: Insufficient documentation

## 2019-09-30 DIAGNOSIS — E039 Hypothyroidism, unspecified: Secondary | ICD-10-CM | POA: Insufficient documentation

## 2019-09-30 DIAGNOSIS — Z823 Family history of stroke: Secondary | ICD-10-CM | POA: Diagnosis not present

## 2019-09-30 DIAGNOSIS — O0993 Supervision of high risk pregnancy, unspecified, third trimester: Secondary | ICD-10-CM | POA: Insufficient documentation

## 2019-09-30 DIAGNOSIS — B952 Enterococcus as the cause of diseases classified elsewhere: Secondary | ICD-10-CM | POA: Diagnosis not present

## 2019-09-30 DIAGNOSIS — Z79899 Other long term (current) drug therapy: Secondary | ICD-10-CM | POA: Diagnosis not present

## 2019-09-30 DIAGNOSIS — O09523 Supervision of elderly multigravida, third trimester: Secondary | ICD-10-CM | POA: Diagnosis not present

## 2019-09-30 DIAGNOSIS — Z3A32 32 weeks gestation of pregnancy: Secondary | ICD-10-CM | POA: Diagnosis not present

## 2019-09-30 LAB — SAMPLE TO BLOOD BANK

## 2019-09-30 LAB — URINALYSIS, COMPLETE (UACMP) WITH MICROSCOPIC
Bilirubin Urine: NEGATIVE
Glucose, UA: NEGATIVE mg/dL
Hgb urine dipstick: NEGATIVE
Ketones, ur: NEGATIVE mg/dL
Leukocytes,Ua: NEGATIVE
Nitrite: NEGATIVE
Protein, ur: NEGATIVE mg/dL
Specific Gravity, Urine: 1.016 (ref 1.005–1.030)
pH: 7 (ref 5.0–8.0)

## 2019-09-30 LAB — FETAL FIBRONECTIN: Fetal Fibronectin: NEGATIVE

## 2019-09-30 MED ORDER — TERBUTALINE SULFATE 1 MG/ML IJ SOLN
INTRAMUSCULAR | Status: AC
  Filled 2019-09-30: qty 1

## 2019-09-30 MED ORDER — TERBUTALINE SULFATE 1 MG/ML IJ SOLN
0.2500 mg | Freq: Once | INTRAMUSCULAR | Status: AC | PRN
  Administered 2019-09-30: 0.25 mg via SUBCUTANEOUS
  Filled 2019-09-30: qty 1

## 2019-09-30 MED ORDER — LACTATED RINGERS IV SOLN: INTRAVENOUS | Status: DC

## 2019-09-30 MED ORDER — PRENATAL MULTIVITAMIN CH: 1.0000 | ORAL_TABLET | Freq: Every day | ORAL | Status: DC

## 2019-09-30 MED ORDER — TERBUTALINE SULFATE 1 MG/ML IJ SOLN
0.2500 mg | Freq: Once | INTRAMUSCULAR | Status: AC
  Administered 2019-09-30: 0.25 mg via SUBCUTANEOUS

## 2019-09-30 MED ORDER — ACETAMINOPHEN 500 MG PO TABS
ORAL_TABLET | ORAL | Status: AC
  Filled 2019-09-30: qty 2

## 2019-09-30 MED ORDER — LACTATED RINGERS IV BOLUS
500.0000 mL | Freq: Once | INTRAVENOUS | Status: AC
  Administered 2019-09-30: 500 mL via INTRAVENOUS

## 2019-09-30 MED ORDER — ACETAMINOPHEN 500 MG PO TABS: 1000.0000 mg | ORAL_TABLET | Freq: Four times a day (QID) | ORAL | 0 refills | Status: DC | PRN

## 2019-09-30 MED ORDER — CYCLOBENZAPRINE HCL 5 MG PO TABS: 5.0000 mg | ORAL_TABLET | Freq: Three times a day (TID) | ORAL | 0 refills | Status: DC | PRN

## 2019-09-30 MED ORDER — ACETAMINOPHEN 500 MG PO TABS
1000.0000 mg | ORAL_TABLET | Freq: Four times a day (QID) | ORAL | Status: DC | PRN
  Administered 2019-09-30: 1000 mg via ORAL

## 2019-09-30 NOTE — Progress Notes (Signed)
Patient told RN in triage that her BP usually runs low. In triage, BP was 96/63 at 1331. At 1345 patient called out for RN with complaints of lightheadedness and SOB. RN adjusted patient pillows and sat her up in bed and took a BP. BP at 1346 was 54/34. CNM notified of complaint and BP and at bedside. Order given to start IV. BP retake at 1350 was 92/62 and patient states she feels much better.

## 2019-09-30 NOTE — Discharge Summary (Signed)
Brianna Brown is a 44 y.o. female. She is at [redacted]w[redacted]d gestation. Patient's last menstrual period was 02/15/2019. Estimated Date of Delivery: 11/22/19  Prenatal care site: [facility]  Current pregnancy complicated by:  1. Previous CS x 3 2. Advanced maternal age, 44yo 3. 1st child has achondroplasia 4. Hx bulging discs- LBP  5. Hx preterm birth with G3 at 35wks, G2 at 63wks 6. Depression, stable on Zoloft 100mg  daily 7. Hypothyroidism: on levothyroxine daily 8. Hx vasovagal responses and low BP, usually 80-90/40-50s.   Chief complaint: lower abdominal and pelvic pain, started while standing up in kitchen working on lunch for her boys. Sudden sharp pain that very gradually decreased, also having some low back pain. Noted UCs after arrival, and usually has them daily.  Reports drinking about 10 glasses of water daily.   S: Resting comfortably now. no VB.no LOF,  Active fetal movement. Denies: HA, visual changes, SOB, or RUQ/epigastric pain  Maternal Medical History:   Past Medical History:  Diagnosis Date  . Asthma   . Hypothyroidism     Past Surgical History:  Procedure Laterality Date  . CESAREAN SECTION     x's 3    No Known Allergies  Prior to Admission medications   Medication Sig Start Date End Date Taking? Authorizing Provider  levothyroxine (SYNTHROID) 25 MCG tablet Take 1/2 tablet by mouth daily. 09/26/19  Yes 09/28/19, PA-C  Prenatal Vit-Fe Fumarate-FA (MULTIVITAMIN-PRENATAL) 27-0.8 MG TABS tablet Take 1 tablet by mouth daily at 12 noon.   Yes [provider]  sertraline (ZOLOFT) 100 MG tablet Take 1 tablet (100 mg total) by mouth daily. 09/26/19  Yes 09/28/19, PA-C  acetaminophen (TYLENOL) 500 MG tablet Take 2 tablets (1,000 mg total) by mouth every 6 (six) hours as needed for moderate pain. 09/30/19   Anita Laguna, 10/02/19, CNM  albuterol (PROVENTIL HFA;VENTOLIN HFA) 108 (90 Base) MCG/ACT inhaler Inhale 2 puffs into the lungs  every 4 (four) hours as needed for wheezing or shortness of breath. Patient not taking: Reported on 08/17/2018 02/26/17 04/05/18  04/07/18, PA-C  cyclobenzaprine (FLEXERIL) 5 MG tablet Take 1 tablet (5 mg total) by mouth 3 (three) times daily as needed for muscle spasms. 09/30/19   Ernie Kasler, 10/02/19, CNM      Social History: She  reports that she has never smoked. She has never used smokeless tobacco. She reports current alcohol use. She reports that she does not use drugs.  Family History: family history includes CVA (age of onset: 20) in her brother; Healthy in her brother and sister; Heart attack in her father; Hypertension in her mother; Lymphoma in her maternal grandmother.   Review of Systems: A full review of systems was performed and negative except as noted in the HPI.     O:  BP (!) 95/45   Pulse 96   Temp 97.9 F (36.6 C) (Oral)   Resp 16   Ht 5\' 3"  (1.6 m)   Wt 63.5 kg   LMP 02/15/2019   BMI 24.80 kg/m  Results for orders placed or performed during the hospital encounter of 09/30/19 (from the past 48 hour(s))  Fetal fibronectin   Collection Time: 09/30/19  2:02 PM  Result Value Ref Range   Fetal Fibronectin NEGATIVE NEGATIVE  Sample to Blood Bank   Collection Time: 09/30/19  2:02 PM  Result Value Ref Range   Blood Bank Specimen SAMPLE AVAILABLE FOR TESTING    Sample Expiration  10/03/2019,2359 Performed at [facility], [address] [place], [place] [address]      Constitutional: NAD, AAOx3  HE/ENT: extraocular movements grossly intact, moist mucous membranes CV: RRR PULM: nl respiratory effort, CTABL     Abd: gravid, non-tender, non-distended, soft      Ext: Non-tender, Nonedematous   Psych: mood appropriate, speech normal Pelvic: Dilation: Closed (out os is 1) Effacement (%): Thick Exam by:: Laural Benes RN   Fetal  monitoring: Cat I Appropriate for GA Baseline: 145bpm Variability: moderate Accelerations: present x >2 Decelerations  absent  TOCO: irreg UCs 1-19min with UI on arrival, then cessation of UCs after initial dose of Terb. UCs started back again around 1730 and pt reports full bladder with no void since arrival.  Repeat terbutaline given.   Pt has had 2 vasovagal episodes since arrival, both when laying flat on her back.     A/P: 44 y.o. [redacted]w[redacted]d here for antenatal surveillance for preterm contractions.   Principle Diagnosis:  High risk pregnancy in third trimester   Preterm labor: not present, negative FFN.    Fetal Wellbeing: Reassuring Cat 1 tracing, Reactive NST  Encouraged PO hydration, consider support belt, tylenol and flexeril prn.    continue Makena weekly.   D/c home stable, precautions reviewed, follow-up as scheduled.    Randa Ngo, CNM 09/30/2019  6:48 PM

## 2019-09-30 NOTE — OB Triage Note (Signed)
RN reviewed discharge instructions with patient. Patient verbalized understanding.

## 2019-09-30 NOTE — Progress Notes (Signed)
Patient called out and reported feeling lightheaded. BP at 1820 was 72/44. Bolus of LR initiated. CNM notified.

## 2019-09-30 NOTE — OB Triage Note (Signed)
Patient [id number] [redacted]w[redacted]d presents to labor and delivery with complaints of contractions/abdominal cramping since noon. Patient denies leaking of fluids and vaginal bleeding/discharge. Pt reports little hydration today and was trying to drink water before coming in for evaluation. Patient reports positive fetal movement. Vital signs stable. Monitors applied and assessing.

## 2019-10-02 ENCOUNTER — Encounter: Payer: Self-pay | Admitting: Obstetrics and Gynecology

## 2019-10-02 ENCOUNTER — Observation Stay
Admission: EM | Admit: 2019-10-02 | Discharge: 2019-10-03 | Disposition: A | Discharge status: Home / Self Care | Attending: Obstetrics and Gynecology | Admitting: Obstetrics and Gynecology

## 2019-10-02 ENCOUNTER — Observation Stay

## 2019-10-02 ENCOUNTER — Other Ambulatory Visit: Payer: Self-pay

## 2019-10-02 DIAGNOSIS — Z79899 Other long term (current) drug therapy: Secondary | ICD-10-CM | POA: Diagnosis not present

## 2019-10-02 DIAGNOSIS — O99343 Other mental disorders complicating pregnancy, third trimester: Secondary | ICD-10-CM | POA: Diagnosis not present

## 2019-10-02 DIAGNOSIS — E039 Hypothyroidism, unspecified: Secondary | ICD-10-CM | POA: Diagnosis not present

## 2019-10-02 DIAGNOSIS — O09213 Supervision of pregnancy with history of pre-term labor, third trimester: Secondary | ICD-10-CM | POA: Insufficient documentation

## 2019-10-02 DIAGNOSIS — O99513 Diseases of the respiratory system complicating pregnancy, third trimester: Secondary | ICD-10-CM | POA: Insufficient documentation

## 2019-10-02 DIAGNOSIS — O09523 Supervision of elderly multigravida, third trimester: Secondary | ICD-10-CM | POA: Diagnosis not present

## 2019-10-02 DIAGNOSIS — O99283 Endocrine, nutritional and metabolic diseases complicating pregnancy, third trimester: Secondary | ICD-10-CM | POA: Insufficient documentation

## 2019-10-02 DIAGNOSIS — O368130 Decreased fetal movements, third trimester, not applicable or unspecified: Secondary | ICD-10-CM | POA: Diagnosis not present

## 2019-10-02 DIAGNOSIS — J45909 Unspecified asthma, uncomplicated: Secondary | ICD-10-CM | POA: Insufficient documentation

## 2019-10-02 DIAGNOSIS — Z3A32 32 weeks gestation of pregnancy: Secondary | ICD-10-CM | POA: Diagnosis not present

## 2019-10-02 DIAGNOSIS — F329 Major depressive disorder, single episode, unspecified: Secondary | ICD-10-CM | POA: Insufficient documentation

## 2019-10-02 DIAGNOSIS — I83811 Varicose veins of right lower extremities with pain: Secondary | ICD-10-CM

## 2019-10-02 DIAGNOSIS — O368190 Decreased fetal movements, unspecified trimester, not applicable or unspecified: Secondary | ICD-10-CM | POA: Diagnosis present

## 2019-10-02 DIAGNOSIS — Z79890 Hormone replacement therapy (postmenopausal): Secondary | ICD-10-CM | POA: Insufficient documentation

## 2019-10-02 LAB — PLATELET COUNT: Platelets: 207 10*3/uL (ref 150–400)

## 2019-10-02 LAB — CBC
HCT: 35.6 % — ABNORMAL LOW (ref 36.0–46.0)
Hemoglobin: 12.3 g/dL (ref 12.0–15.0)
MCH: 30 pg (ref 26.0–34.0)
MCHC: 34.6 g/dL (ref 30.0–36.0)
MCV: 86.8 fL (ref 80.0–100.0)
Platelets: 203 10*3/uL (ref 150–400)
RBC: 4.1 MIL/uL (ref 3.87–5.11)
RDW: 12.4 % (ref 11.5–15.5)
WBC: 9.9 10*3/uL (ref 4.0–10.5)
nRBC: 0 % (ref 0.0–0.2)

## 2019-10-02 LAB — KLEIHAUER-BETKE STAIN
Fetal Cells %: 0 %
Quantitation Fetal Hemoglobin: 0 mL

## 2019-10-02 LAB — FIBRINOGEN: Fibrinogen: 571 mg/dL — ABNORMAL HIGH (ref 210–475)

## 2019-10-02 LAB — TECHNOLOGIST SMEAR REVIEW: Plt Morphology: NORMAL

## 2019-10-02 LAB — ABO/RH: ABO/RH(D): O POS

## 2019-10-02 LAB — PROTIME-INR
INR: 1.1 (ref 0.8–1.2)
Prothrombin Time: 13.5 seconds (ref 11.4–15.2)

## 2019-10-02 LAB — APTT: aPTT: 30 seconds (ref 24–36)

## 2019-10-02 LAB — TYPE AND SCREEN
ABO/RH(D): O POS
Antibody Screen: NEGATIVE

## 2019-10-02 LAB — FIBRIN DERIVATIVES D-DIMER (ARMC ONLY): Fibrin derivatives D-dimer (ARMC): 1470.39 ng/mL (FEU) — ABNORMAL HIGH (ref 0.00–499.00)

## 2019-10-02 MED ORDER — AMOXICILLIN 500 MG PO CAPS
500.0000 mg | ORAL_CAPSULE | Freq: Three times a day (TID) | ORAL | Status: DC
  Administered 2019-10-02 – 2019-10-03 (×3): 500 mg via ORAL
  Filled 2019-10-02 (×3): qty 1

## 2019-10-02 MED ORDER — LACTATED RINGERS IV SOLN: Freq: Once | INTRAVENOUS | Status: AC

## 2019-10-02 MED ORDER — ACETAMINOPHEN 500 MG PO TABS: 1000.0000 mg | ORAL_TABLET | Freq: Four times a day (QID) | ORAL | Status: DC | PRN

## 2019-10-02 MED ORDER — AMOXICILLIN 500 MG PO CAPS: 500.0000 mg | ORAL_CAPSULE | Freq: Three times a day (TID) | ORAL | 0 refills | Status: AC

## 2019-10-02 MED ORDER — LACTATED RINGERS IV BOLUS
1000.0000 mL | Freq: Once | INTRAVENOUS | Status: AC
  Administered 2019-10-02: 1000 mL via INTRAVENOUS

## 2019-10-02 NOTE — H&P (Addendum)
OB History & Physical   History of Present Illness:  Chief Complaint: decreased fetal movement after a fall  HPI:  Brianna Brown is a 44 y.o. G4 P33 female at 108w3d dated by LMP and c/w [place] at [redacted]w[redacted]d.  She presents to L&D for decreased fetal movement after a fall on her abdomen at 1315. Also reports new onset pain and redness on right lateral knee.     Pregnancy Issues: 1. Previous CS x 3 2. Advanced maternal age, 67yo 3. 1st child has achondroplasia 4. Hx bulging discs- LBP  5. Hx preterm birth with G3 at 35wks, G2 at 64wks 6. Depression, stable on Zoloft 100mg  daily 7. Hypothyroidism: on levothyroxine daily 8. Hx vasovagal responses and low BP, usually 80-90/40-50s.    Maternal Medical History:   Past Medical History:  Diagnosis Date  . Asthma   . Hypothyroidism     Past Surgical History:  Procedure Laterality Date  . CESAREAN SECTION     x's 3    No Known Allergies  Prior to Admission medications   Medication Sig Start Date End Date Taking? Authorizing Provider  acetaminophen (TYLENOL) 500 MG tablet Take 2 tablets (1,000 mg total) by mouth every 6 (six) hours as needed for moderate pain. 09/30/19  Yes Maui Ahart, 10/02/19, CNM  cyclobenzaprine (FLEXERIL) 5 MG tablet Take 1 tablet (5 mg total) by mouth 3 (three) times daily as needed for muscle spasms. 09/30/19  Yes Manford Sprong, 10/02/19, CNM  levothyroxine (SYNTHROID) 25 MCG tablet Take 1/2 tablet by mouth daily. 09/26/19  Yes 09/28/19, PA-C  Prenatal Vit-Fe Fumarate-FA (MULTIVITAMIN-PRENATAL) 27-0.8 MG TABS tablet Take 1 tablet by mouth daily at 12 noon.   Yes [provider]  sertraline (ZOLOFT) 100 MG tablet Take 1 tablet (100 mg total) by mouth daily. 09/26/19  Yes 09/28/19, PA-C     Prenatal care site: [facility]    Social History: She  reports that she has never smoked. She has never used smokeless tobacco. She reports current alcohol use. She reports that she does not use  drugs.  Family History: family history includes CVA (age of onset: 72) in her brother; Healthy in her brother and sister; Heart attack in her father; Hypertension in her mother; Lymphoma in her maternal grandmother.   Review of Systems: A full review of systems was performed and negative except as noted in the HPI.     Physical Exam:  Vital Signs: BP (!) 104/59 (BP Location: Left Arm)   Pulse 82   Resp 16   Ht 5\' 3"  (1.6 m)   Wt 63.5 kg   LMP 02/15/2019   BMI 24.80 kg/m  General: no acute distress.  HEENT: normocephalic, atraumatic Heart: regular rate & rhythm.  No murmurs/rubs/gallops Lungs: clear to auscultation bilaterally, normal respiratory effort Abdomen: soft, gravid, non-tender Pelvic: deferred   Extremities: non-tender, symmetric, no edema bilaterally. Right lateral knee with 3cm erythematous area, very painful per pt. Bilateral varicosities noted.  DTRs: 2+  Neurologic: Alert & oriented x 3.    No results found for this or any previous visit (from the past 24 hour(s)).  Pertinent Results:  Prenatal Labs: Blood type/Rh  O Pos  Antibody screen neg  Rubella Immune  Varicella Immune  RPR NR  HBsAg Neg  HIV NR  GC neg  Chlamydia neg  Genetic screening Negative MaterniT21 and AFP.   1 hour GTT  117  GBS  n/a   FHT: 140bpm, moderate variability, +  accels, no decels TOCO: q6-9min, palpate mild      No results found.  Assessment:  Brianna Brown is a 44 y.o. G19P1200 female at [redacted]w[redacted]d with decreased fetal movement after a fall.   Plan:  1. Admit to Labor & Delivery; consents reviewed and obtained - D/w Dr Feliberto Gottron regarding POC.  - Labs ordered including type and screen, CBC, KB screen and DIC panel.  - IV bolus and continuous fluids ordered for hydration.  - continuous EFM and toco x 4hrs, if no UCs at 4hrs, will consider DC home.   - wet prep and FFN done on 7/23 with visit for preterm contractions.  - urine culture from 7/23: Positive for E  Faecalis. Rx Amoxicllin 500 TID - OB Limited US ordered for presentation, placental location and AFI. BPP requested.  - Right leg with superficial thrombophlebitis on Doppler US of right extremity, no DVT, TED hose thigh high placed.   2. Fetal Well being  - Fetal Tracing: Cat I - Group B Streptococcus ppx indicated: n/a  3. Routine OB: - Prenatal labs reviewed, as above - Rh O Pos - Clear fluid diet, IVF- LR  Randa Ngo, CNM 10/02/19 1:58 PM

## 2019-10-02 NOTE — Progress Notes (Signed)
Accelerations in 180's at 2032-2046 after returning from the restroom. Baseline returned to the 140's-150's range at 2047. Pt was allow to eat 2030-2100 per provider.

## 2019-10-02 NOTE — Progress Notes (Signed)
ANTEPARTUM PROGRESS NOTE  Brianna Brown is a 44 y.o. G4P1200 at [redacted]w[redacted]d with EDC of Estimated Date of Delivery: 11/22/19 who is admitted for decreased FM after a fall at 1315 today.   Length of Stay:  0 Days. Admitted 10/02/2019  Subjective: Patient reports good fetal movement.  She reports irregular uncomfortable uterine contractions, no bleeding and no loss of fluid per vagina.  Vitals:  BP (!) 94/59   Pulse 79   Resp 16   Ht 5\' 3"  (1.6 m)   Wt 63.5 kg   LMP 02/15/2019   BMI 24.80 kg/m   Physical Examination: General: no acute distress.  HEENT: normocephalic, atraumatic Heart: regular rate & rhythm.  No murmurs/rubs/gallops Lungs: clear to auscultation bilaterally, normal respiratory effort Abdomen: soft, gravid, non-tender Pelvic: deferred Extremities: non-tender, symmetric, no edema bilaterally.  DTRs: 2+, negative Homans  Neurologic: Alert & oriented x 3.    Fetal monitoring: FHR: 125bpm, moderate variability, + accels, no decels Uterine activity:  Irregular, currently q3-72min with irritability  Results for orders placed or performed during the hospital encounter of 10/02/19 (from the past 48 hour(s))  Kleihauer-Betke stain     Status: None   Collection Time: 10/02/19  2:13 PM  Result Value Ref Range   Fetal Cells % 0 %   Quantitation Fetal Hemoglobin 0.0000 mL    Comment: UP TO 15 MLS   # Vials RhIg NOT INDICATED     Comment: Performed at [facility], [address] [place], [place]  CBC on admission     Status: Abnormal   Collection Time: 10/02/19  2:13 PM  Result Value Ref Range   WBC 9.9 4.0 - 10.5 K/uL   RBC 4.10 3.87 - 5.11 MIL/uL   Hemoglobin 12.3 12.0 - 15.0 g/dL   HCT 10/04/19 (L) 36 - 46 %   MCV 86.8 80.0 - 100.0 fL   MCH 30.0 26.0 - 34.0 pg   MCHC 34.6 30.0 - 36.0 g/dL   RDW 83.1 51.7 - 61.6 %   Platelets 203 150 - 400 K/uL   nRBC 0.0 0.0 - 0.2 %    Comment: Performed at [facility], [address] [place],  [place]  Type and screen     Status: None   Collection Time: 10/02/19  2:13 PM  Result Value Ref Range   ABO/RH(D) O POS    Antibody Screen NEG    Sample Expiration      10/05/2019,2359 Performed at [facility], [address] [place], [place]   Fibrin derivatives D-Dimer [facility] only)     Status: Abnormal   Collection Time: 10/02/19  2:13 PM  Result Value Ref Range   Fibrin derivatives D-dimer ([facility]) 1,470.39 (H) 0.00 - 499.00 ng/mL (FEU)    Comment: (NOTE) <> Exclusion of Venous Thromboembolism (VTE) - OUTPATIENT ONLY   (Emergency Department or [place])    0-499 ng/ml (FEU): With a low to intermediate pretest probability                      for VTE this test result excludes the diagnosis                      of VTE.   >499 ng/ml (FEU) : VTE not excluded; additional work up for VTE is                      required.  <>  Testing on Inpatients and Evaluation of Disseminated Intravascular   Coagulation (DIC) Reference Range:   0-499 ng/ml (FEU) Performed at [facility], [address] [place], [place] [address]   Fibrinogen     Status: Abnormal   Collection Time: 10/02/19  2:13 PM  Result Value Ref Range   Fibrinogen 571 (H) 210 - 475 mg/dL    Comment: Performed at [facility], [address] [place], [place] [address]  Platelet count     Status: None   Collection Time: 10/02/19  2:13 PM  Result Value Ref Range   Platelets 207 150 - 400 K/uL    Comment: Performed at [facility], [address] [place], [place] [address]  Protime-INR     Status: None   Collection Time: 10/02/19  2:13 PM  Result Value Ref Range   Prothrombin Time 13.5 11.4 - 15.2 seconds   INR 1.1 0.8 - 1.2    Comment: (NOTE) INR goal varies based on device and disease states. Performed at [facility], [address] [place], [place] [address]   APTT     Status: None   Collection Time: 10/02/19  2:13 PM  Result Value Ref Range   aPTT  30 24 - 36 seconds    Comment: Performed at [facility], [address] [place], [place] [address]  Technologist smear review     Status: None   Collection Time: 10/02/19  2:13 PM  Result Value Ref Range   WBC Morphology MORPHOLOGY UNREMARKABLE    RBC Morphology MORPHOLOGY UNREMARKABLE    Tech Review Normal platelet morphology     Comment: Performed at [facility], [address] [place], [place] [address]    [place] [employer]  Result Date: 10/02/2019 CLINICAL DATA:  44 year old pregnant female with fall. Decreased fetal movement. EXAM: LIMITED OBSTETRIC ULTRASOUND FINDINGS: Number of Fetuses: 1 Heart Rate:  150 bpm Movement: Detected Presentation: Cephalic Placental Location: Anterior Previa: No Amniotic Fluid (Subjective):  Within normal limits. AFI: 16 cm BPD: 8.2 cm 32 w  6 d MATERNAL FINDINGS: Cervix:  Appears closed. Uterus/Adnexae: No abnormality visualized. IMPRESSION: Single live intrauterine pregnancy with an estimated gestational age of [redacted] weeks, 6 days based on BPD. No acute findings. This exam is performed on an emergent basis and does not comprehensively evaluate fetal size, dating, or anatomy; follow-up complete OB US should be considered if further fetal assessment is warranted. Electronically Signed   By: Elgie Collard M.D.   On: 10/02/2019 15:22   US Fetal BPP W/O Non Stress  Result Date: 10/02/2019 CLINICAL DATA:  Pregnant at [redacted] weeks 5 days, fall, decreased fetal movement EXAM: BIOPHYSICAL PROFILE FINDINGS: Number of Fetuses: 1 Heart Rate: 150 bpm Presentation: Cephalic Movement: 2 time: <10 minutes Breathing: 2 Tone: 2 Amniotic Fluid: 2 Total Score: 8 IMPRESSION: Fetal BPP score 8/8. Electronically Signed   By: Ulyses Southward M.D.   On: 10/02/2019 15:24   US Venous Img Lower Unilateral Right (DVT)  Result Date: 10/02/2019 CLINICAL DATA:  Tender varicose vein lateral to right knee x2 days. Pregnant EXAM: RIGHT LOWER EXTREMITY VENOUS DOPPLER ULTRASOUND  TECHNIQUE: Gray-scale sonography with compression, as well as color and duplex ultrasound, were performed to evaluate the deep venous system(s) from the level of the common femoral vein through the popliteal and proximal calf veins. COMPARISON:  None. FINDINGS: VENOUS Normal compressibility of the common femoral, superficial femoral, and popliteal veins, as well as the visualized calf veins. Visualized portions  of profunda femoral vein and great saphenous vein unremarkable. No filling defects to suggest DVT on grayscale or color Doppler imaging. Doppler waveforms show normal direction of venous flow, normal respiratory phasicity and response to augmentation. Limited views of the contralateral common femoral vein are unremarkable. OTHER Subcutaneous tortuous varicose veins lateral to the right knee. There is segmental occlusion corresponding to region of concern. Other segments remain compressible and patent. Limitations: none IMPRESSION: 1. Negative for right lower extremity DVT. 2. Superficial thrombophlebitis involving a segment of varicose veins, lateral right knee. Electronically Signed   By: Corlis Leak M.D.   On: 10/02/2019 15:01    Current scheduled medications . amoxicillin  500 mg Oral Q8H    I have reviewed the patient's current medications.  ASSESSMENT: Patient Active Problem List   Diagnosis Date Noted  . Decreased fetal movement affecting management of pregnancy 10/02/2019  . Preterm uterine contractions in third trimester, antepartum 09/30/2019  . Abdominal cramping affecting pregnancy 09/11/2019  . Exercise-induced asthma 02/26/2017  . Encounter for surveillance of contraceptive pills 02/26/2017  . Hypothyroidism 02/26/2017  . History of anorexia nervosa 02/26/2017    PLAN: Continuous EFM and Toco monitoring Continue clear liquids, will consider regular diet later if uterine activity stops.  TED hose for right knee lateral superficial thrombophlebitis   Randa Ngo,  CNM 10/02/2019  6:55 PM

## 2019-10-02 NOTE — OB Triage Note (Signed)
Pt. Presented to L/D triage from home after reported fall at 1315. She tripped on a stool and fell with the major of the impact on her hands, but some on her upper abdomen. She denies bleeding or LOF and states positive fetal movement. She reports no abdominal pain. Abdomen is soft and not tender to touch.  She also reports a lump on her right leg that she noticed last night. It is a raised, quarter-sized lump that is red. VSS. Monitors applied and assessing.

## 2019-10-03 DIAGNOSIS — O368130 Decreased fetal movements, third trimester, not applicable or unspecified: Secondary | ICD-10-CM | POA: Diagnosis not present

## 2019-10-03 LAB — CULTURE, OB URINE: Culture: >=100000 — AB

## 2019-10-03 NOTE — OB Triage Note (Signed)
Discharge instructions reviewed with patient. Patient verbalized understanding. Discharged in stable condition.

## 2019-10-03 NOTE — Discharge Summary (Signed)
Patient ID: Brianna Brown MRN: [id number] DOB/AGE: 1975/05/03 44 y.o.  Admit date: 10/02/2019 Discharge date: 10/03/2019  Admission Diagnoses: decreased fetal movement, s/p fall  Discharge Diagnoses: Reassuring fetal status, PTL not present   Current pregnancy complicated by:  1. Previous CS x 3 2. Advanced maternal age, 62yo 3. 1st child has achondroplasia 4. Hx bulging discs- LBP  5. Hx preterm birth with G3 at 35wks, G2 at 67wks 6. Depression, stable on Zoloft 100mg  daily 7. Hypothyroidism: on levothyroxine daily 8. Hx vasovagal responses and low BP, usually 80-90/40-50s.   Prenatal Procedures: NST  Consults: Neonatology, Maternal Fetal Medicine  Significant Diagnostic Studies:  Results for orders placed or performed during the hospital encounter of 10/02/19 (from the past 168 hour(s))  Kleihauer-Betke stain   Collection Time: 10/02/19  2:13 PM  Result Value Ref Range   Fetal Cells % 0 %   Quantitation Fetal Hemoglobin 0.0000 mL   # Vials RhIg NOT INDICATED   CBC on admission   Collection Time: 10/02/19  2:13 PM  Result Value Ref Range   WBC 9.9 4.0 - 10.5 K/uL   RBC 4.10 3.87 - 5.11 MIL/uL   Hemoglobin 12.3 12.0 - 15.0 g/dL   HCT 10/04/19 (L) 36 - 46 %   MCV 86.8 80.0 - 100.0 fL   MCH 30.0 26.0 - 34.0 pg   MCHC 34.6 30.0 - 36.0 g/dL   RDW 35.5 73.2 - 20.2 %   Platelets 203 150 - 400 K/uL   nRBC 0.0 0.0 - 0.2 %  Fibrin derivatives D-Dimer ([facility] only)   Collection Time: 10/02/19  2:13 PM  Result Value Ref Range   Fibrin derivatives D-dimer ([facility]) 1,470.39 (H) 0.00 - 499.00 ng/mL (FEU)  Fibrinogen   Collection Time: 10/02/19  2:13 PM  Result Value Ref Range   Fibrinogen 571 (H) 210 - 475 mg/dL  Platelet count   Collection Time: 10/02/19  2:13 PM  Result Value Ref Range   Platelets 207 150 - 400 K/uL  Protime-INR   Collection Time: 10/02/19  2:13 PM  Result Value Ref Range   Prothrombin Time 13.5 11.4 - 15.2 seconds   INR 1.1 0.8 - 1.2  APTT    Collection Time: 10/02/19  2:13 PM  Result Value Ref Range   aPTT 30 24 - 36 seconds  Technologist smear review   Collection Time: 10/02/19  2:13 PM  Result Value Ref Range   WBC Morphology MORPHOLOGY UNREMARKABLE    RBC Morphology MORPHOLOGY UNREMARKABLE    Tech Review Normal platelet morphology   Type and screen   Collection Time: 10/02/19  2:13 PM  Result Value Ref Range   ABO/RH(D) O POS    Antibody Screen NEG    Sample Expiration      10/05/2019,2359 Performed at [facility], [address] [place], [place]   ABO/Rh   Collection Time: 10/02/19  6:17 PM  Result Value Ref Range   ABO/RH(D)      O POS Performed at [facility], [address] [place], [place]   Results for orders placed or performed during the hospital encounter of 09/30/19 (from the past 168 hour(s))  Fetal fibronectin   Collection Time: 09/30/19  2:02 PM  Result Value Ref Range   Fetal Fibronectin NEGATIVE NEGATIVE  Sample to Blood Bank   Collection Time: 09/30/19  2:02 PM  Result Value Ref Range   Blood Bank Specimen SAMPLE AVAILABLE FOR TESTING    Sample Expiration  10/03/2019,2359 Performed at [facility], [address] [place], [place] [address]   [facility] Urine Culture   Collection Time: 09/30/19  2:03 PM   Specimen: Urine, Random  Result Value Ref Range   Specimen Description      URINE, RANDOM Performed at [facility], [address] [place], [place] [address]    Special Requests      NONE Performed at [facility], [address] [place], [place] [address]    Culture >=100,000 COLONIES/mL ENTEROCOCCUS FAECALIS (A)    Report Status 10/03/2019 FINAL    Organism ID, Bacteria ENTEROCOCCUS FAECALIS (A)       Susceptibility   Enterococcus faecalis - MIC*    AMPICILLIN <=2 SENSITIVE Sensitive     NITROFURANTOIN <=16 SENSITIVE Sensitive     VANCOMYCIN 1 SENSITIVE Sensitive     * >=100,000 COLONIES/mL ENTEROCOCCUS  FAECALIS  Urinalysis, Complete w Microscopic   Collection Time: 09/30/19  2:03 PM  Result Value Ref Range   Color, Urine YELLOW (A) YELLOW   APPearance HAZY (A) CLEAR   Specific Gravity, Urine 1.016 1.005 - 1.030   pH 7.0 5.0 - 8.0   Glucose, UA NEGATIVE NEGATIVE mg/dL   Hgb urine dipstick NEGATIVE NEGATIVE   Bilirubin Urine NEGATIVE NEGATIVE   Ketones, ur NEGATIVE NEGATIVE mg/dL   Protein, ur NEGATIVE NEGATIVE mg/dL   Nitrite NEGATIVE NEGATIVE   Leukocytes,Ua NEGATIVE NEGATIVE   WBC, UA 0-5 0 - 5 WBC/hpf   Bacteria, UA RARE (A) NONE SEEN   Squamous Epithelial / LPF 0-5 0 - 5   Mucus PRESENT     Treatments: IV hydration  Hospital Course:  This is a 43 y.o. [id number] with IUP at [redacted]w[redacted]d admitted for electronic fetal monitoring after a fall.  She presented to L&D for decreased fetal movement after a fall on her abdomen at 1315 yesterday.  Also reported new onset pain and redness on the right lateral knee. [place] and labs were reassuring for intact placenta.  Doppler of right knee was c/w superficial thrombophlebitis.  She was monitored overnight. Contractions decreased to occasional.  Denies LOF and/or vaginal bleeding.  Now feeling fetal movement.  She was deemed stable for discharge to home with outpatient follow up.  Discharge Physical Exam:  BP (!) 103/58 (BP Location: Left Arm)   Pulse 81   Temp 98.1 F (36.7 C) (Oral)   Resp 18   Ht 5\' 3"  (1.6 m)   Wt 63.5 kg   LMP 02/15/2019   BMI 24.80 kg/m   General: NAD CV: RRR Pulm: CTABL, nl effort ABD: s/nd/nt, gravid Extremities: non-tender, symmetric, no edema bilaterally. Right lateral knee with 3cm erythematous area, tender to palpitation. Bilateral varicosities noted.  DTRs: 2+  DVT Evaluation: LE non-ttp, no evidence of DVT on exam.  SVE: deferred  FHT: 125bpm, moderate variability, accels present, decels absent TOCO: occasional mild contractions   Discharge Condition: Stable   Preterm labor: not present  Superficial  thrombophlebitis of right knee - no evidence of DVT on venous doppler     Fetal Wellbeing: Reassuring Cat 1 tracing, Reactive NST  Encouraged rest today, Tylenol OTC PRN pain  Return for contractions, LOF, vaginal bleeding, or decreased fetal movement  D/c home stable, precautions reviewed, follow-up as scheduled.   Disposition: Discharge disposition: 01-Home or Self Care        Allergies as of 10/03/2019   No Known Allergies     Medication List    TAKE these medications  acetaminophen 500 MG tablet Commonly known as: TYLENOL Take 2 tablets (1,000 mg total) by mouth every 6 (six) hours as needed for moderate pain.   amoxicillin 500 MG capsule Commonly known as: AMOXIL Take 1 capsule (500 mg total) by mouth every 8 (eight) hours for 7 days.   cyclobenzaprine 5 MG tablet Commonly known as: FLEXERIL Take 1 tablet (5 mg total) by mouth 3 (three) times daily as needed for muscle spasms.   levothyroxine 25 MCG tablet Commonly known as: SYNTHROID Take 1/2 tablet by mouth daily.   multivitamin-prenatal 27-0.8 MG Tabs tablet Take 1 tablet by mouth daily at 12 noon.   sertraline 100 MG tablet Commonly known as: ZOLOFT Take 1 tablet (100 mg total) by mouth daily.        Signed:  Gustavo Lah 10/03/2019 8:04 AM ----- Margaretmary Eddy, CNM Certified Nurse Midwife [facility]

## 2019-10-08 ENCOUNTER — Ambulatory Visit: Attending: Internal Medicine

## 2019-10-08 DIAGNOSIS — Z23 Encounter for immunization: Secondary | ICD-10-CM

## 2019-10-08 NOTE — Progress Notes (Signed)
   Covid-19 Vaccination Clinic  Name:  Naliya Gish    MRN: [id number] DOB: [birth year]  10/08/2019  Ms. Depaul was observed post Covid-19 immunization for 15 minutes without incident. She was provided with Vaccine Information Sheet and instruction to access the V-Safe system.   Ms. Bryand was instructed to call 911 with any severe reactions post vaccine: Marland Kitchen Difficulty breathing  . Swelling of face and throat  . A fast heartbeat  . A bad rash all over body  . Dizziness and weakness   Immunizations Administered    Name Date Dose VIS Date Route   Pfizer COVID-19 Vaccine 10/08/2019 10:06 AM 0.3 mL 05/04/2018 Intramuscular   Manufacturer: [employer], [employer]   Lot: [id number]   NDC: [address]-5

## 2019-10-09 ENCOUNTER — Other Ambulatory Visit: Payer: Self-pay

## 2019-10-09 ENCOUNTER — Encounter: Payer: Self-pay | Admitting: Obstetrics & Gynecology

## 2019-10-09 ENCOUNTER — Observation Stay
Admission: EM | Admit: 2019-10-09 | Discharge: 2019-10-09 | Disposition: A | Discharge status: Home / Self Care | Attending: Obstetrics and Gynecology | Admitting: Obstetrics and Gynecology

## 2019-10-09 DIAGNOSIS — O99283 Endocrine, nutritional and metabolic diseases complicating pregnancy, third trimester: Secondary | ICD-10-CM | POA: Insufficient documentation

## 2019-10-09 DIAGNOSIS — O4703 False labor before 37 completed weeks of gestation, third trimester: Principal | ICD-10-CM | POA: Insufficient documentation

## 2019-10-09 DIAGNOSIS — E039 Hypothyroidism, unspecified: Secondary | ICD-10-CM | POA: Insufficient documentation

## 2019-10-09 DIAGNOSIS — Z79890 Hormone replacement therapy (postmenopausal): Secondary | ICD-10-CM | POA: Diagnosis not present

## 2019-10-09 DIAGNOSIS — F329 Major depressive disorder, single episode, unspecified: Secondary | ICD-10-CM | POA: Insufficient documentation

## 2019-10-09 DIAGNOSIS — J45909 Unspecified asthma, uncomplicated: Secondary | ICD-10-CM | POA: Insufficient documentation

## 2019-10-09 DIAGNOSIS — O34219 Maternal care for unspecified type scar from previous cesarean delivery: Secondary | ICD-10-CM | POA: Insufficient documentation

## 2019-10-09 DIAGNOSIS — O09213 Supervision of pregnancy with history of pre-term labor, third trimester: Secondary | ICD-10-CM | POA: Diagnosis not present

## 2019-10-09 DIAGNOSIS — Z79899 Other long term (current) drug therapy: Secondary | ICD-10-CM | POA: Insufficient documentation

## 2019-10-09 DIAGNOSIS — O99513 Diseases of the respiratory system complicating pregnancy, third trimester: Secondary | ICD-10-CM | POA: Insufficient documentation

## 2019-10-09 DIAGNOSIS — Z3A33 33 weeks gestation of pregnancy: Secondary | ICD-10-CM | POA: Diagnosis not present

## 2019-10-09 DIAGNOSIS — O99343 Other mental disorders complicating pregnancy, third trimester: Secondary | ICD-10-CM | POA: Insufficient documentation

## 2019-10-09 DIAGNOSIS — O09523 Supervision of elderly multigravida, third trimester: Secondary | ICD-10-CM | POA: Diagnosis not present

## 2019-10-09 DIAGNOSIS — O09893 Supervision of other high risk pregnancies, third trimester: Secondary | ICD-10-CM | POA: Insufficient documentation

## 2019-10-09 LAB — CHLAMYDIA/NGC RT PCR (ARMC ONLY)
Chlamydia Tr: NOT DETECTED
N gonorrhoeae: NOT DETECTED

## 2019-10-09 LAB — WET PREP, GENITAL
Clue Cells Wet Prep HPF POC: NONE SEEN
Sperm: NONE SEEN
Trich, Wet Prep: NONE SEEN
Yeast Wet Prep HPF POC: NONE SEEN

## 2019-10-09 LAB — GROUP B STREP BY PCR: Group B strep by PCR: NEGATIVE

## 2019-10-09 MED ORDER — TERBUTALINE SULFATE 1 MG/ML IJ SOLN
INTRAMUSCULAR | Status: AC
  Filled 2019-10-09: qty 1

## 2019-10-09 MED ORDER — TERBUTALINE SULFATE 1 MG/ML IJ SOLN
INTRAMUSCULAR | Status: AC
  Administered 2019-10-09: 0.25 mg via SUBCUTANEOUS
  Filled 2019-10-09: qty 1

## 2019-10-09 MED ORDER — TERBUTALINE SULFATE 1 MG/ML IJ SOLN
0.2500 mg | Freq: Once | INTRAMUSCULAR | Status: AC
  Administered 2019-10-09: 0.25 mg via SUBCUTANEOUS

## 2019-10-09 MED ORDER — BETAMETHASONE SOD PHOS & ACET 6 (3-3) MG/ML IJ SUSP
12.0000 mg | INTRAMUSCULAR | Status: DC
  Administered 2019-10-09: 12 mg via INTRAMUSCULAR

## 2019-10-09 MED ORDER — TERBUTALINE SULFATE 1 MG/ML IJ SOLN: 0.2500 mg | Freq: Once | INTRAMUSCULAR | Status: AC

## 2019-10-09 NOTE — OB Triage Note (Signed)
Discharge instructions reviewed with patient. Patient verbalized understanding. Discharged in stable condition. PTL red flags reviewed with provider. Pt d/c with spouse.

## 2019-10-09 NOTE — Discharge Summary (Addendum)
Brianna Brown is a 44 y.o. female. She is at [redacted]w[redacted]d gestation. Patient's last menstrual period was 02/15/2019. Estimated Date of Delivery: 11/22/19  Prenatal care site: [facility]  Current pregnancy complicated by:  1. Previous CS x 3 2. Advanced maternal age, 44yo 3. 1st child has achondroplasia 4. Hx bulging discs- LBP  5. Hx preterm birth with G3 at 35wks, G2 at 60wks 6. Depression, stable on Zoloft 100mg  daily 7. Hypothyroidism: on levothyroxine daily 8. Hx vasovagal responses and low BP, usually 80-90/40-50s.  9. Preterm contractions at 32.3wks, FFN neg 09/30/19 10. S/p fall on abdomen 7/25, neg KB  Chief complaint: menstrual like cramps that started around 1230 today after a trip to [employer]. Has been PO hydrating. Denies VB, LOF. Active FM. Completed abx for UTI yesterday.   S: Resting comfortably now. no VB.no LOF,  Active fetal movement. Denies: HA, visual changes, SOB, or RUQ/epigastric pain  Maternal Medical History:   Past Medical History:  Diagnosis Date  . Asthma   . Hypothyroidism     Past Surgical History:  Procedure Laterality Date  . CESAREAN SECTION     x's 3    No Known Allergies  Prior to Admission medications   Medication Sig Start Date End Date Taking? Authorizing Provider  levothyroxine (SYNTHROID) 25 MCG tablet Take 1/2 tablet by mouth daily. 09/26/19  Yes 09/28/19, PA-C  Prenatal Vit-Fe Fumarate-FA (MULTIVITAMIN-PRENATAL) 27-0.8 MG TABS tablet Take 1 tablet by mouth daily at 12 noon.   Yes [provider]  sertraline (ZOLOFT) 100 MG tablet Take 1 tablet (100 mg total) by mouth daily. 09/26/19  Yes 09/28/19, PA-C  acetaminophen (TYLENOL) 500 MG tablet Take 2 tablets (1,000 mg total) by mouth every 6 (six) hours as needed for moderate pain. 09/30/19   Alaena Strader, 10/02/19, CNM  albuterol (PROVENTIL HFA;VENTOLIN HFA) 108 (90 Base) MCG/ACT inhaler Inhale 2 puffs into the lungs every 4 (four) hours as needed for  wheezing or shortness of breath. Patient not taking: Reported on 08/17/2018 02/26/17 04/05/18  04/07/18, PA-C  cyclobenzaprine (FLEXERIL) 5 MG tablet Take 1 tablet (5 mg total) by mouth 3 (three) times daily as needed for muscle spasms. 09/30/19   Uri Turnbough, 10/02/19, CNM      Social History: She  reports that she has never smoked. She has never used smokeless tobacco. She reports current alcohol use. She reports that she does not use drugs.  Family History: family history includes CVA (age of onset: 44) in her brother; Healthy in her brother and sister; Heart attack in her father; Hypertension in her mother; Lymphoma in her maternal grandmother.   Review of Systems: A full review of systems was performed and negative except as noted in the HPI.     O:  BP 97/68 (BP Location: Left Arm)   Pulse 87   Temp 98.6 F (37 C) (Oral)   Resp 16   LMP 02/15/2019  Results for orders placed or performed during the hospital encounter of 10/09/19 (from the past 48 hour(s))  Chlamydia/NGC rt PCR ([facility] only)   Collection Time: 10/09/19  5:05 PM   Specimen: Cervical/Vaginal swab  Result Value Ref Range   Specimen source GC/Chlam ENDOCERVICAL    Chlamydia Tr NOT DETECTED NOT DETECTED   N gonorrhoeae NOT DETECTED NOT DETECTED  Wet prep, genital   Collection Time: 10/09/19  5:05 PM   Specimen: Cervical/Vaginal swab  Result Value Ref Range   Yeast Wet Prep HPF  POC NONE SEEN NONE SEEN   Trich, Wet Prep NONE SEEN NONE SEEN   Clue Cells Wet Prep HPF POC NONE SEEN NONE SEEN   WBC, Wet Prep HPF POC MODERATE (A) NONE SEEN   Sperm NONE SEEN   Group B strep by PCR   Collection Time: 10/09/19  5:05 PM   Specimen: Cervical/Vaginal swab; Genital  Result Value Ref Range   Group B strep by PCR NEGATIVE NEGATIVE     Constitutional: NAD, AAOx3  HE/ENT: extraocular movements grossly intact, moist mucous membranes CV: RRR PULM: nl respiratory effort, CTABL     Abd: gravid, non-tender, non-distended,  soft      Ext: Non-tender, Nonedematous   Psych: mood appropriate, speech normal Pelvic: Dilation: 1 Effacement (%): Thick Station: Ballotable Exam by:: R Kathya Wilz CNM    Fetal  monitoring: Cat I Appropriate for GA Baseline: 145bpm Variability: moderate Accelerations: present x >2 Decelerations absent  TOCO: irreg UCs 3-45min with UI on arrival, then cessation of UCs after initial dose of Terb. UCs started back again around 1900, Repeat terbutaline given with cessation of UCs that did not return after 1hr.      A/P: 44 y.o. [redacted]w[redacted]d here for antenatal surveillance for preterm contractions.   Principle Diagnosis:  High risk pregnancy in third trimester   Preterm labor: not present, negative FFN on 7/23.    Fetal Wellbeing: Reassuring Cat 1 tracing, Reactive NST  Encouraged PO hydration, consider support belt, tylenol and flexeril prn.    Pelvic rest.   Initial BMZ given, will repeat tomorrow at 1700  continue Makena weekly.   D/c home stable, precautions reviewed, follow-up as scheduled.    Randa Ngo, CNM 10/09/2019  9:10 PM

## 2019-10-09 NOTE — OB Triage Note (Signed)
Patient presents to triage for contractions that started around 1230. They have been 3-7 minutes apart. No LOF denies bleeding.

## 2019-10-09 NOTE — Progress Notes (Signed)
Terbutaline 0.25mg  given per CNM order

## 2019-10-10 ENCOUNTER — Observation Stay: Admission: RE | Admit: 2019-10-10 | Discharge: 2019-10-10 | Disposition: A | Discharge status: Home / Self Care

## 2019-10-10 DIAGNOSIS — O99513 Diseases of the respiratory system complicating pregnancy, third trimester: Secondary | ICD-10-CM | POA: Diagnosis not present

## 2019-10-10 DIAGNOSIS — O99283 Endocrine, nutritional and metabolic diseases complicating pregnancy, third trimester: Secondary | ICD-10-CM | POA: Insufficient documentation

## 2019-10-10 DIAGNOSIS — Z79899 Other long term (current) drug therapy: Secondary | ICD-10-CM | POA: Insufficient documentation

## 2019-10-10 DIAGNOSIS — O09523 Supervision of elderly multigravida, third trimester: Secondary | ICD-10-CM | POA: Diagnosis not present

## 2019-10-10 DIAGNOSIS — J45909 Unspecified asthma, uncomplicated: Secondary | ICD-10-CM | POA: Insufficient documentation

## 2019-10-10 DIAGNOSIS — Z3A33 33 weeks gestation of pregnancy: Secondary | ICD-10-CM | POA: Insufficient documentation

## 2019-10-10 DIAGNOSIS — Z8751 Personal history of pre-term labor: Secondary | ICD-10-CM

## 2019-10-10 DIAGNOSIS — E039 Hypothyroidism, unspecified: Secondary | ICD-10-CM | POA: Diagnosis not present

## 2019-10-10 DIAGNOSIS — Z79890 Hormone replacement therapy (postmenopausal): Secondary | ICD-10-CM | POA: Insufficient documentation

## 2019-10-10 MED ORDER — BETAMETHASONE SOD PHOS & ACET 6 (3-3) MG/ML IJ SUSP: 12.0000 mg | Freq: Once | INTRAMUSCULAR | Status: AC

## 2019-10-10 MED ORDER — BETAMETHASONE SOD PHOS & ACET 6 (3-3) MG/ML IJ SUSP
INTRAMUSCULAR | Status: AC
  Administered 2019-10-10: 12 mg via INTRAMUSCULAR
  Filled 2019-10-10: qty 5

## 2019-10-10 NOTE — OB Triage Note (Signed)
Pt. presented to L/D triage for scheduled second dose of betamethasone. She denies bleeding, ctx, or LOF. States positive fetal movement.

## 2019-10-10 NOTE — Discharge Summary (Signed)
Brianna Brown is a 44 y.o. female. She is at [redacted]w[redacted]d gestation. Patient's last menstrual period was 02/15/2019. Estimated Date of Delivery: 11/22/19  Prenatal care site: [facility]  Chief complaint: 2nd betamethasone injection   S: Resting comfortably. no CTX, no VB.no LOF,  Active fetal movement.   Maternal Medical History:  Past Medical Hx:  has a past medical history of Asthma and Hypothyroidism.    Past Surgical Hx:  has a past surgical history that includes Cesarean section.   No Known Allergies   Prior to Admission medications   Medication Sig Start Date End Date Taking? Authorizing Provider  acetaminophen (TYLENOL) 500 MG tablet Take 2 tablets (1,000 mg total) by mouth every 6 (six) hours as needed for moderate pain. 09/30/19   McVey, Prudencio Pair, CNM  cyclobenzaprine (FLEXERIL) 5 MG tablet Take 1 tablet (5 mg total) by mouth 3 (three) times daily as needed for muscle spasms. 09/30/19   McVey, Prudencio Pair, CNM  levothyroxine (SYNTHROID) 25 MCG tablet Take 1/2 tablet by mouth daily. 09/26/19   Trey Sailors, PA-C  Prenatal Vit-Fe Fumarate-FA (MULTIVITAMIN-PRENATAL) 27-0.8 MG TABS tablet Take 1 tablet by mouth daily at 12 noon.    [provider]  sertraline (ZOLOFT) 100 MG tablet Take 1 tablet (100 mg total) by mouth daily. 09/26/19   Trey Sailors, PA-C    Social History: She  reports that she has never smoked. She has never used smokeless tobacco. She reports current alcohol use. She reports that she does not use drugs.  Family History: family history includes CVA (age of onset: 57) in her brother; Healthy in her brother and sister; Heart attack in her father; Hypertension in her mother; Lymphoma in her maternal grandmother.   Review of Systems: A full review of systems was performed and negative except as noted in the HPI.    O:  LMP 02/15/2019  Results for orders placed or performed during the hospital encounter of 10/09/19 (from the past 48 hour(s))   Chlamydia/NGC rt PCR ([facility] only)   Collection Time: 10/09/19  5:05 PM   Specimen: Cervical/Vaginal swab  Result Value Ref Range   Specimen source GC/Chlam ENDOCERVICAL    Chlamydia Tr NOT DETECTED NOT DETECTED   N gonorrhoeae NOT DETECTED NOT DETECTED  Wet prep, genital   Collection Time: 10/09/19  5:05 PM   Specimen: Cervical/Vaginal swab  Result Value Ref Range   Yeast Wet Prep HPF POC NONE SEEN NONE SEEN   Trich, Wet Prep NONE SEEN NONE SEEN   Clue Cells Wet Prep HPF POC NONE SEEN NONE SEEN   WBC, Wet Prep HPF POC MODERATE (A) NONE SEEN   Sperm NONE SEEN   Group B strep by PCR   Collection Time: 10/09/19  5:05 PM   Specimen: Cervical/Vaginal swab; Genital  Result Value Ref Range   Group B strep by PCR NEGATIVE NEGATIVE      Assessment: 44 y.o. [redacted]w[redacted]d here for antenatal surveillance during pregnancy.  Principle diagnosis: Betamethasone injection   Plan:  Labor: not present.   2nd betamethosone injection given   D/c home stable, precautions reviewed, follow-up as scheduled.   ----- Margaretmary Eddy, CNM Certified Nurse Midwife [facility]

## 2019-10-18 ENCOUNTER — Encounter: Payer: Self-pay | Admitting: Obstetrics and Gynecology

## 2019-10-18 ENCOUNTER — Observation Stay
Admission: EM | Admit: 2019-10-18 | Discharge: 2019-10-19 | Disposition: A | Discharge status: Home / Self Care | Source: Home / Self Care | Admitting: Obstetrics and Gynecology

## 2019-10-18 ENCOUNTER — Other Ambulatory Visit: Payer: Self-pay

## 2019-10-18 DIAGNOSIS — F329 Major depressive disorder, single episode, unspecified: Secondary | ICD-10-CM | POA: Diagnosis not present

## 2019-10-18 DIAGNOSIS — O99344 Other mental disorders complicating childbirth: Secondary | ICD-10-CM | POA: Diagnosis not present

## 2019-10-18 DIAGNOSIS — J45909 Unspecified asthma, uncomplicated: Secondary | ICD-10-CM | POA: Insufficient documentation

## 2019-10-18 DIAGNOSIS — O99284 Endocrine, nutritional and metabolic diseases complicating childbirth: Secondary | ICD-10-CM | POA: Diagnosis not present

## 2019-10-18 DIAGNOSIS — D62 Acute posthemorrhagic anemia: Secondary | ICD-10-CM | POA: Diagnosis not present

## 2019-10-18 DIAGNOSIS — Z3A35 35 weeks gestation of pregnancy: Secondary | ICD-10-CM | POA: Insufficient documentation

## 2019-10-18 DIAGNOSIS — O4703 False labor before 37 completed weeks of gestation, third trimester: Secondary | ICD-10-CM | POA: Diagnosis present

## 2019-10-18 DIAGNOSIS — O34211 Maternal care for low transverse scar from previous cesarean delivery: Secondary | ICD-10-CM | POA: Diagnosis not present

## 2019-10-18 DIAGNOSIS — Z20822 Contact with and (suspected) exposure to covid-19: Secondary | ICD-10-CM | POA: Diagnosis not present

## 2019-10-18 DIAGNOSIS — O9081 Anemia of the puerperium: Secondary | ICD-10-CM | POA: Diagnosis not present

## 2019-10-18 DIAGNOSIS — E039 Hypothyroidism, unspecified: Secondary | ICD-10-CM | POA: Insufficient documentation

## 2019-10-18 MED ORDER — OXYCODONE-ACETAMINOPHEN 5-325 MG PO TABS: 1.0000 | ORAL_TABLET | Freq: Once | ORAL | Status: AC

## 2019-10-18 MED ORDER — TERBUTALINE SULFATE 1 MG/ML IJ SOLN: 0.2500 mg | Freq: Once | INTRAMUSCULAR | Status: AC

## 2019-10-18 MED ORDER — OXYCODONE-ACETAMINOPHEN 5-325 MG PO TABS
ORAL_TABLET | ORAL | Status: AC
  Administered 2019-10-18: 1 via ORAL
  Filled 2019-10-18: qty 1

## 2019-10-18 MED ORDER — TERBUTALINE SULFATE 1 MG/ML IJ SOLN
INTRAMUSCULAR | Status: AC
  Administered 2019-10-18: 0.25 mg via SUBCUTANEOUS
  Filled 2019-10-18: qty 1

## 2019-10-18 NOTE — Discharge Summary (Signed)
Brianna Brown is a 44 y.o. female. She is at [redacted]w[redacted]d gestation. Patient's last menstrual period was 02/15/2019. Estimated Date of Delivery: 11/22/19  Prenatal care site: [facility]  Current pregnancy complicated by:  1. Previous CS x 3 2. Advanced maternal age, 44yo 3. 1st child has achondroplasia 4. Hx bulging discs- LBP  5. Hx preterm birth with G3 at 35wks, G2 at 12wks 6. Depression, stable on Zoloft 100mg  daily 7. Hypothyroidism: on levothyroxine daily 8. Hx vasovagal responses and low BP, usually 80-90/40-50s.  9. Preterm contractions at 32.3wks, FFN neg 09/30/19 10. S/p fall on abdomen 7/25, neg KB  Chief complaint: Irregular uncomfortable UCs for last 2 days Denies VB, LOF. Active FM.    S: Resting comfortably now. no VB.no LOF,  Active fetal movement. Denies: HA, visual changes, SOB, or RUQ/epigastric pain  Maternal Medical History:   Past Medical History:  Diagnosis Date  . Asthma   . Hypothyroidism     Past Surgical History:  Procedure Laterality Date  . CESAREAN SECTION     x's 3    No Known Allergies  Prior to Admission medications   Medication Sig Start Date End Date Taking? Authorizing Provider  levothyroxine (SYNTHROID) 25 MCG tablet Take 1/2 tablet by mouth daily. 09/26/19  Yes 09/28/19, PA-C  Prenatal Vit-Fe Fumarate-FA (MULTIVITAMIN-PRENATAL) 27-0.8 MG TABS tablet Take 1 tablet by mouth daily at 12 noon.   Yes [provider]  sertraline (ZOLOFT) 100 MG tablet Take 1 tablet (100 mg total) by mouth daily. 09/26/19  Yes 09/28/19, PA-C  acetaminophen (TYLENOL) 500 MG tablet Take 2 tablets (1,000 mg total) by mouth every 6 (six) hours as needed for moderate pain. 09/30/19   Bryon Parker, 10/02/19, CNM  albuterol (PROVENTIL HFA;VENTOLIN HFA) 108 (90 Base) MCG/ACT inhaler Inhale 2 puffs into the lungs every 4 (four) hours as needed for wheezing or shortness of breath. Patient not taking: Reported on 08/17/2018 02/26/17 04/05/18   04/07/18, PA-C  cyclobenzaprine (FLEXERIL) 5 MG tablet Take 1 tablet (5 mg total) by mouth 3 (three) times daily as needed for muscle spasms. 09/30/19   Zyiah Withington, 10/02/19, CNM      Social History: She  reports that she has never smoked. She has never used smokeless tobacco. She reports current alcohol use. She reports that she does not use drugs.  Family History: family history includes CVA (age of onset: 55) in her brother; Healthy in her brother and sister; Heart attack in her father; Hypertension in her mother; Lymphoma in her maternal grandmother.   Review of Systems: A full review of systems was performed and negative except as noted in the HPI.     O:  BP 101/63   Pulse (!) 116   Temp 98.3 F (36.8 C) (Oral)   Ht 5\' 3"  (1.6 m)   Wt 64.9 kg   LMP 02/15/2019   BMI 25.33 kg/m  No results found for this or any previous visit (from the past 48 hour(s)).   Constitutional: NAD, AAOx3  HE/ENT: extraocular movements grossly intact, moist mucous membranes CV: RRR PULM: nl respiratory effort, CTABL     Abd: gravid, non-tender, non-distended, soft      Ext: Non-tender, Nonedematous   Psych: mood appropriate, speech normal Pelvic:   deferred, no LOF, discharge or VB.    Fetal  monitoring: Cat I Appropriate for GA Baseline: 145bpm Variability: moderate Accelerations: present x >2 Decelerations absent  TOCO: irreg occasional UCs with UI  on arrival, then cessation of UCs after initial dose of Terb.     A/P: 44 y.o. [redacted]w[redacted]d here for antenatal surveillance for preterm contractions.   Principle Diagnosis:  High risk pregnancy in third trimester   Preterm labor: not present, cessation of UCs after terb.   Fetal Wellbeing: Reassuring Cat 1 tracing, Reactive NST  Encouraged PO hydration, consider support belt, tylenol and flexeril prn (rx at home)    Pelvic rest.   Given percocet x 1 dose for discomfort.   continue Makena weekly, next appt on Thursday.   D/c home  stable, precautions reviewed, follow-up as scheduled.    Randa Ngo, CNM 10/19/2019  12:12 AM

## 2019-10-19 NOTE — OB Triage Note (Signed)
Patient came in for observation for pretermlabor evaluation. Patient reports irregular uterine contractions x 2 days. Patient rates pain 7/10. Patient denies leaking of fluid and but denies vaginal bleeding and spotting. Patient reports +FM. Vital signs stable and patient afebrile. FHR baseline 145 with moderate variability with accelerations 15 x 15 and no decelerations. Significant other at bedside. Monitors applied and tracing.

## 2019-10-20 ENCOUNTER — Inpatient Hospital Stay
Admission: EM | Admit: 2019-10-20 | Discharge: 2019-10-23 | DRG: 787 | Disposition: A | Discharge status: Home / Self Care

## 2019-10-20 ENCOUNTER — Other Ambulatory Visit: Payer: Self-pay

## 2019-10-20 ENCOUNTER — Encounter: Admission: EM | Disposition: A | Discharge status: Home / Self Care | Payer: Self-pay | Source: Home / Self Care

## 2019-10-20 ENCOUNTER — Inpatient Hospital Stay: Admitting: Anesthesiology

## 2019-10-20 ENCOUNTER — Encounter: Payer: Self-pay | Admitting: Obstetrics and Gynecology

## 2019-10-20 DIAGNOSIS — Z20822 Contact with and (suspected) exposure to covid-19: Secondary | ICD-10-CM | POA: Diagnosis present

## 2019-10-20 DIAGNOSIS — O34211 Maternal care for low transverse scar from previous cesarean delivery: Secondary | ICD-10-CM | POA: Diagnosis present

## 2019-10-20 DIAGNOSIS — F329 Major depressive disorder, single episode, unspecified: Secondary | ICD-10-CM | POA: Diagnosis present

## 2019-10-20 DIAGNOSIS — D62 Acute posthemorrhagic anemia: Secondary | ICD-10-CM | POA: Diagnosis not present

## 2019-10-20 DIAGNOSIS — Z3A35 35 weeks gestation of pregnancy: Secondary | ICD-10-CM | POA: Diagnosis not present

## 2019-10-20 DIAGNOSIS — E039 Hypothyroidism, unspecified: Secondary | ICD-10-CM | POA: Diagnosis present

## 2019-10-20 DIAGNOSIS — O99284 Endocrine, nutritional and metabolic diseases complicating childbirth: Secondary | ICD-10-CM | POA: Diagnosis present

## 2019-10-20 DIAGNOSIS — O9081 Anemia of the puerperium: Secondary | ICD-10-CM | POA: Diagnosis not present

## 2019-10-20 DIAGNOSIS — O479 False labor, unspecified: Secondary | ICD-10-CM | POA: Diagnosis present

## 2019-10-20 DIAGNOSIS — O99344 Other mental disorders complicating childbirth: Secondary | ICD-10-CM | POA: Diagnosis present

## 2019-10-20 DIAGNOSIS — O4700 False labor before 37 completed weeks of gestation, unspecified trimester: Secondary | ICD-10-CM | POA: Diagnosis present

## 2019-10-20 HISTORY — DX: Depression, unspecified: F32.A

## 2019-10-20 LAB — SARS CORONAVIRUS 2 BY RT PCR (HOSPITAL ORDER, PERFORMED IN ~~LOC~~ HOSPITAL LAB): SARS Coronavirus 2: NEGATIVE

## 2019-10-20 LAB — TYPE AND SCREEN
ABO/RH(D): O POS
Antibody Screen: NEGATIVE

## 2019-10-20 LAB — CBC
HCT: 30.5 % — ABNORMAL LOW (ref 36.0–46.0)
Hemoglobin: 10.2 g/dL — ABNORMAL LOW (ref 12.0–15.0)
MCH: 29.1 pg (ref 26.0–34.0)
MCHC: 33.4 g/dL (ref 30.0–36.0)
MCV: 87.1 fL (ref 80.0–100.0)
Platelets: 219 10*3/uL (ref 150–400)
RBC: 3.5 MIL/uL — ABNORMAL LOW (ref 3.87–5.11)
RDW: 12.2 % (ref 11.5–15.5)
WBC: 11.2 10*3/uL — ABNORMAL HIGH (ref 4.0–10.5)
nRBC: 0 % (ref 0.0–0.2)

## 2019-10-20 SURGERY — Surgical Case
Anesthesia: Spinal

## 2019-10-20 MED ORDER — DIPHENHYDRAMINE HCL 50 MG/ML IJ SOLN
12.5000 mg | INTRAMUSCULAR | Status: DC | PRN
  Administered 2019-10-20: 12.5 mg via INTRAVENOUS
  Filled 2019-10-20: qty 1

## 2019-10-20 MED ORDER — CEFAZOLIN SODIUM-DEXTROSE 2-3 GM-%(50ML) IV SOLR
INTRAVENOUS | Status: DC | PRN
  Administered 2019-10-20: 2 g via INTRAVENOUS

## 2019-10-20 MED ORDER — ACETAMINOPHEN 500 MG PO TABS
1000.0000 mg | ORAL_TABLET | Freq: Four times a day (QID) | ORAL | Status: DC
  Administered 2019-10-21: 1000 mg via ORAL
  Filled 2019-10-20: qty 2

## 2019-10-20 MED ORDER — DIPHENHYDRAMINE HCL 25 MG PO CAPS: 25.0000 mg | ORAL_CAPSULE | ORAL | Status: DC | PRN

## 2019-10-20 MED ORDER — KETOROLAC TROMETHAMINE 30 MG/ML IJ SOLN
30.0000 mg | Freq: Four times a day (QID) | INTRAMUSCULAR | Status: DC
  Administered 2019-10-20: 30 mg via INTRAVENOUS
  Filled 2019-10-20: qty 1

## 2019-10-20 MED ORDER — SODIUM CHLORIDE 0.9% FLUSH: 3.0000 mL | INTRAVENOUS | Status: DC | PRN

## 2019-10-20 MED ORDER — SOD CITRATE-CITRIC ACID 500-334 MG/5ML PO SOLN
30.0000 mL | ORAL | Status: AC
  Administered 2019-10-20: 30 mL via ORAL
  Filled 2019-10-20: qty 30

## 2019-10-20 MED ORDER — BUPIVACAINE LIPOSOME 1.3 % IJ SUSP
INTRAMUSCULAR | Status: AC
  Filled 2019-10-20: qty 20

## 2019-10-20 MED ORDER — BUPIVACAINE HCL (PF) 0.5 % IJ SOLN
INTRAMUSCULAR | Status: AC
  Filled 2019-10-20: qty 30

## 2019-10-20 MED ORDER — CALCIUM CARBONATE ANTACID 500 MG PO CHEW: 2.0000 | CHEWABLE_TABLET | ORAL | Status: DC | PRN

## 2019-10-20 MED ORDER — FENTANYL CITRATE (PF) 100 MCG/2ML IJ SOLN
INTRAMUSCULAR | Status: DC | PRN
  Administered 2019-10-20: 15 ug via INTRATHECAL

## 2019-10-20 MED ORDER — SODIUM CHLORIDE 0.9 % IV SOLN
INTRAVENOUS | Status: DC | PRN
  Administered 2019-10-20: 70 mL

## 2019-10-20 MED ORDER — ACETAMINOPHEN 325 MG PO TABS
650.0000 mg | ORAL_TABLET | ORAL | Status: DC | PRN
  Administered 2019-10-20: 650 mg via ORAL
  Filled 2019-10-20: qty 2

## 2019-10-20 MED ORDER — SODIUM CHLORIDE (PF) 0.9 % IJ SOLN
INTRAMUSCULAR | Status: AC
  Filled 2019-10-20: qty 50

## 2019-10-20 MED ORDER — MORPHINE SULFATE (PF) 0.5 MG/ML IJ SOLN
INTRAMUSCULAR | Status: DC | PRN
  Administered 2019-10-20: .1 mg via INTRATHECAL

## 2019-10-20 MED ORDER — ONDANSETRON HCL 4 MG/2ML IJ SOLN: 4.0000 mg | Freq: Three times a day (TID) | INTRAMUSCULAR | Status: DC | PRN

## 2019-10-20 MED ORDER — BUPIVACAINE IN DEXTROSE 0.75-8.25 % IT SOLN
INTRATHECAL | Status: DC | PRN
  Administered 2019-10-20: 1.6 mL via INTRATHECAL

## 2019-10-20 MED ORDER — ONDANSETRON HCL 4 MG/2ML IJ SOLN
INTRAMUSCULAR | Status: DC | PRN
  Administered 2019-10-20: 4 mg via INTRAVENOUS

## 2019-10-20 MED ORDER — BUPIVACAINE LIPOSOME 1.3 % IJ SUSP: 20.0000 mL | Freq: Once | INTRAMUSCULAR | Status: DC

## 2019-10-20 MED ORDER — NALBUPHINE HCL 10 MG/ML IJ SOLN: 5.0000 mg | INTRAMUSCULAR | Status: DC | PRN

## 2019-10-20 MED ORDER — MORPHINE SULFATE (PF) 0.5 MG/ML IJ SOLN
INTRAMUSCULAR | Status: AC
  Filled 2019-10-20: qty 10

## 2019-10-20 MED ORDER — OXYTOCIN-SODIUM CHLORIDE 30-0.9 UT/500ML-% IV SOLN
INTRAVENOUS | Status: AC
  Filled 2019-10-20: qty 1000

## 2019-10-20 MED ORDER — SODIUM CHLORIDE 0.9 % IV SOLN
INTRAVENOUS | Status: DC | PRN
  Administered 2019-10-20: 50 ug/min via INTRAVENOUS

## 2019-10-20 MED ORDER — OXYTOCIN-SODIUM CHLORIDE 30-0.9 UT/500ML-% IV SOLN
INTRAVENOUS | Status: DC | PRN
  Administered 2019-10-20: 30 [IU] via INTRAVENOUS

## 2019-10-20 MED ORDER — PHENYLEPHRINE HCL (PRESSORS) 10 MG/ML IV SOLN
INTRAVENOUS | Status: DC | PRN
  Administered 2019-10-20 (×2): 100 ug via INTRAVENOUS

## 2019-10-20 MED ORDER — NALBUPHINE HCL 10 MG/ML IJ SOLN: 5.0000 mg | Freq: Once | INTRAMUSCULAR | Status: DC | PRN

## 2019-10-20 MED ORDER — SODIUM CHLORIDE 0.9 % IV SOLN
500.0000 mg | INTRAVENOUS | Status: DC
  Administered 2019-10-20: 500 mg via INTRAVENOUS
  Filled 2019-10-20 (×2): qty 500

## 2019-10-20 MED ORDER — FENTANYL CITRATE (PF) 100 MCG/2ML IJ SOLN
INTRAMUSCULAR | Status: AC
  Filled 2019-10-20: qty 2

## 2019-10-20 MED ORDER — LACTATED RINGERS IV SOLN: INTRAVENOUS | Status: DC | PRN

## 2019-10-20 MED ORDER — NALOXONE HCL 0.4 MG/ML IJ SOLN: 0.4000 mg | INTRAMUSCULAR | Status: DC | PRN

## 2019-10-20 MED ORDER — SCOPOLAMINE 1 MG/3DAYS TD PT72
1.0000 | MEDICATED_PATCH | Freq: Once | TRANSDERMAL | Status: AC
  Administered 2019-10-20: 1.5 mg via TRANSDERMAL
  Filled 2019-10-20: qty 1

## 2019-10-20 MED ORDER — ONDANSETRON HCL 4 MG/2ML IJ SOLN
INTRAMUSCULAR | Status: AC
  Filled 2019-10-20: qty 2

## 2019-10-20 MED ORDER — BUPIVACAINE HCL (PF) 0.25 % IJ SOLN
INTRAMUSCULAR | Status: DC | PRN
  Administered 2019-10-20: 30 mL

## 2019-10-20 MED ORDER — LACTATED RINGERS IV BOLUS
1000.0000 mL | Freq: Once | INTRAVENOUS | Status: AC | PRN
  Administered 2019-10-20: 1000 mL via INTRAVENOUS

## 2019-10-20 MED ORDER — CEFAZOLIN SODIUM-DEXTROSE 2-4 GM/100ML-% IV SOLN
2.0000 g | INTRAVENOUS | Status: DC
  Filled 2019-10-20: qty 100

## 2019-10-20 MED ORDER — DEXAMETHASONE SODIUM PHOSPHATE 10 MG/ML IJ SOLN
INTRAMUSCULAR | Status: DC | PRN
  Administered 2019-10-20: 10 mg via INTRAVENOUS

## 2019-10-20 MED ORDER — DEXAMETHASONE SODIUM PHOSPHATE 10 MG/ML IJ SOLN
INTRAMUSCULAR | Status: AC
  Filled 2019-10-20: qty 1

## 2019-10-20 MED ORDER — NALOXONE HCL 4 MG/10ML IJ SOLN
1.0000 ug/kg/h | INTRAVENOUS | Status: DC | PRN
  Filled 2019-10-20: qty 5

## 2019-10-20 MED ORDER — MEPERIDINE HCL 25 MG/ML IJ SOLN: 6.2500 mg | INTRAMUSCULAR | Status: DC | PRN

## 2019-10-20 SURGICAL SUPPLY — 27 items
BARRIER ADHS 3X4 INTERCEED (GAUZE/BANDAGES/DRESSINGS) ×2 IMPLANT
CANISTER SUCT 3000ML PPV (MISCELLANEOUS) ×2 IMPLANT
CHLORAPREP W/TINT 26 (MISCELLANEOUS) ×2 IMPLANT
COVER WAND RF STERILE (DRAPES) ×2 IMPLANT
DRSG TELFA 3X8 NADH (GAUZE/BANDAGES/DRESSINGS) ×2 IMPLANT
ELECT REM PT RETURN 9FT ADLT (ELECTROSURGICAL) ×2
ELECTRODE REM PT RTRN 9FT ADLT (ELECTROSURGICAL) ×1 IMPLANT
GAUZE SPONGE 4X4 12PLY STRL (GAUZE/BANDAGES/DRESSINGS) ×2 IMPLANT
GOWN STRL REUS W/ TWL LRG LVL3 (GOWN DISPOSABLE) ×3 IMPLANT
GOWN STRL REUS W/TWL LRG LVL3 (GOWN DISPOSABLE) ×3
NEEDLE HYPO 25GX1X1/2 BEV (NEEDLE) ×2 IMPLANT
NS IRRIG 1000ML POUR BTL (IV SOLUTION) ×2 IMPLANT
PACK C SECTION (MISCELLANEOUS) ×2 IMPLANT
PAD OB MATERNITY 4.3X12.25 (PERSONAL CARE ITEMS) ×2 IMPLANT
PAD PREP 24X41 OB/GYN DISP (PERSONAL CARE ITEMS) ×2 IMPLANT
PENCIL SMOKE ULTRAEVAC 22 CON (MISCELLANEOUS) ×2 IMPLANT
STAPLER INSORB 30 2030 C-SECTI (MISCELLANEOUS) ×2 IMPLANT
STAPLER SKIN PROX 35W (STAPLE) ×2 IMPLANT
SUT MNCRL 4-0 (SUTURE) ×1
SUT MNCRL 4-0 27XMFL (SUTURE) ×1
SUT VIC AB 0 CT1 36 (SUTURE) ×6 IMPLANT
SUT VIC AB 0 CTX 36 (SUTURE) ×2
SUT VIC AB 0 CTX36XBRD ANBCTRL (SUTURE) ×2 IMPLANT
SUT VIC AB 2-0 SH 27 (SUTURE) ×2
SUT VIC AB 2-0 SH 27XBRD (SUTURE) ×2 IMPLANT
SUTURE MNCRL 4-0 27XMF (SUTURE) ×1 IMPLANT
SYR 30ML LL (SYRINGE) ×4 IMPLANT

## 2019-10-20 NOTE — H&P (Signed)
OB History & Physical   History of Present Illness:  Chief Complaint:   HPI:  Brianna Brown is a 44 y.o. [phone] female at [redacted]w[redacted]d dated by LMP, c/w early [place].  She presents to L&D for preterm contractions   Brianna Brown endorses good fetal movement.  Reports that contractions stopped for 2 days after her last triage visit but then they started back again.  She denies vaginal bleeding or LOF.      Pregnancy Issues: 1. Previous CS x 3 2. Advanced maternal age, 28yo 3. 1st child has achondroplasia 4. Hx bulging discs- LBP  5. Hx preterm birth with G3 at 35wks, G2 at 61wks 6. Depression, stable on Zoloft 100mg  daily 7. Hypothyroidism: on levothyroxine daily 8. Hx vasovagal responses and low BP, usually 80-90/40-50s.  9. Preterm contractions at 32.3wks, FFN neg 09/30/19 10. S/p fall on abdomen 7/25, neg KB  Patient Active Problem List   Diagnosis Date Noted  . Preterm contractions 10/20/2019  . Preterm labor 10/20/2019  . History of preterm labor 10/10/2019  . Exercise-induced asthma 02/26/2017  . Hypothyroidism 02/26/2017  . History of anorexia nervosa 02/26/2017     Maternal Medical History:   Past Medical History:  Diagnosis Date  . Asthma    Exercise-Induced  . Depression   . Hypothyroidism     Past Surgical History:  Procedure Laterality Date  . CESAREAN SECTION     x's 3    No Known Allergies  Prior to Admission medications   Medication Sig Start Date End Date Taking? Authorizing Provider  levothyroxine (SYNTHROID) 25 MCG tablet Take 1/2 tablet by mouth daily. 09/26/19  Yes 09/28/19, PA-C  Prenatal Vit-Fe Fumarate-FA (MULTIVITAMIN-PRENATAL) 27-0.8 MG TABS tablet Take 1 tablet by mouth daily at 12 noon.   Yes [provider]  sertraline (ZOLOFT) 100 MG tablet Take 1 tablet (100 mg total) by mouth daily. 09/26/19  Yes 09/28/19, PA-C  acetaminophen (TYLENOL) 500 MG tablet Take 2 tablets (1,000 mg total) by mouth every 6 (six) hours as  needed for moderate pain. Patient not taking: Reported on 10/20/2019 09/30/19   McVey, 10/02/19, CNM  cyclobenzaprine (FLEXERIL) 5 MG tablet Take 1 tablet (5 mg total) by mouth 3 (three) times daily as needed for muscle spasms. Patient not taking: Reported on 10/18/2019 09/30/19   McVey, 10/02/19, CNM     Prenatal care site:  [facility]   Social History: She  reports that she has never smoked. She has never used smokeless tobacco. She reports current alcohol use. She reports that she does not use drugs.  Family History: family history includes CVA (age of onset: 49) in her brother; Healthy in her brother and sister; Heart attack in her father; Hypertension in her mother; Lymphoma in her maternal grandmother.   Review of Systems: A full review of systems was performed and negative except as noted in the HPI.     Physical Exam:  Vital Signs: BP 106/69 (BP Location: Right Arm)   Pulse 74   Temp 98.2 F (36.8 C) (Oral)   Resp 15   LMP 02/15/2019  Physical Exam  General: no acute distress.  HEENT: normocephalic, atraumatic Heart: regular rate & rhythm.  No murmurs/rubs/gallops Lungs: clear to auscultation bilaterally, normal respiratory effort Abdomen: soft, gravid, non-tender Pelvic:   External: Normal external female genitalia  Cervix: Dilation: 2 / Effacement (%): 50 /      Extremities: non-tender, symmetric, No edema bilaterally.  DTRs: 2+/2+, area  of erythema and tenderness on right lateral knee c/w previous exam  Neurologic: Alert & oriented x 3.    No results found for this or any previous visit (from the past 24 hour(s)).  Pertinent Results:  Prenatal Labs: Blood type/Rh O pos  Antibody screen neg  Rubella Immune  Varicella Immune  RPR NR  HBsAg Neg  HIV NR  GC neg  Chlamydia neg  Genetic screening Negative cfDNA and AFP   1 hour GTT 117  3 hour GTT N/A  GBS Not done    FHT: 150bpm, moderate variability, accels present, decels absent  TOCO: Irreg,  mild contractions, every 5-8 minutes  SVE:  1-2/50/-3   Cephalic by leopolds and SVE   US OB Limited  Result Date: 10/02/2019 CLINICAL DATA:  44 year old pregnant female with fall. Decreased fetal movement. EXAM: LIMITED OBSTETRIC ULTRASOUND FINDINGS: Number of Fetuses: 1 Heart Rate:  150 bpm Movement: Detected Presentation: Cephalic Placental Location: Anterior Previa: No Amniotic Fluid (Subjective):  Within normal limits. AFI: 16 cm BPD: 8.2 cm 32 w  6 d MATERNAL FINDINGS: Cervix:  Appears closed. Uterus/Adnexae: No abnormality visualized. IMPRESSION: Single live intrauterine pregnancy with an estimated gestational age of [redacted] weeks, 6 days based on BPD. No acute findings. This exam is performed on an emergent basis and does not comprehensively evaluate fetal size, dating, or anatomy; follow-up complete OB US should be considered if further fetal assessment is warranted. Electronically Signed   By: Elgie Collard M.D.   On: 10/02/2019 15:22   US Fetal BPP W/O Non Stress  Result Date: 10/02/2019 CLINICAL DATA:  Pregnant at [redacted] weeks 5 days, fall, decreased fetal movement EXAM: BIOPHYSICAL PROFILE FINDINGS: Number of Fetuses: 1 Heart Rate: 150 bpm Presentation: Cephalic Movement: 2 time: <10 minutes Breathing: 2 Tone: 2 Amniotic Fluid: 2 Total Score: 8 IMPRESSION: Fetal BPP score 8/8. Electronically Signed   By: Ulyses Southward M.D.   On: 10/02/2019 15:24   US Venous Img Lower Unilateral Right (DVT)  Result Date: 10/02/2019 CLINICAL DATA:  Tender varicose vein lateral to right knee x2 days. Pregnant EXAM: RIGHT LOWER EXTREMITY VENOUS DOPPLER ULTRASOUND TECHNIQUE: Gray-scale sonography with compression, as well as color and duplex ultrasound, were performed to evaluate the deep venous system(s) from the level of the common femoral vein through the popliteal and proximal calf veins. COMPARISON:  None. FINDINGS: VENOUS Normal compressibility of the common femoral, superficial femoral, and popliteal veins, as  well as the visualized calf veins. Visualized portions of profunda femoral vein and great saphenous vein unremarkable. No filling defects to suggest DVT on grayscale or color Doppler imaging. Doppler waveforms show normal direction of venous flow, normal respiratory phasicity and response to augmentation. Limited views of the contralateral common femoral vein are unremarkable. OTHER Subcutaneous tortuous varicose veins lateral to the right knee. There is segmental occlusion corresponding to region of concern. Other segments remain compressible and patent. Limitations: none IMPRESSION: 1. Negative for right lower extremity DVT. 2. Superficial thrombophlebitis involving a segment of varicose veins, lateral right knee. Electronically Signed   By: Corlis Leak M.D.   On: 10/02/2019 15:01    Assessment:  Brianna Brown is a 44 y.o. [id number] female at [redacted]w[redacted]d with preterm labor .   Plan:  1. Admit to Labor & Delivery; consents reviewed and obtained - Dr. Dalbert Garnet notified of assessment. Will admit for repeat c/section - Anesthesia and peds team notified.  - Admission covid testing pending.   2. Fetal Well being  - Fetal Tracing:  Cat 1  - Group B Streptococcus ppx not indicated - intact and repeat c/section  - Presentation: cephalic confirmed by SVE   3. Routine OB: - Prenatal labs reviewed, as above - Rh pos - CBC, T&S, RPR on admit - Clear fluids, IVF  4. Repeat c/section  - Preop antibiotics with Ancef 2 grams  -  Plan for continuous fetal monitoring  - NPO - last meal at 1300 - Pain management per anesthesia  - Anticipate repeat c/section   5. Post Partum Planning: - Infant feeding: TBD  - Contraception: Vasectomy  - Tdap given 09/02/2019 - Flu - due in season - Covid vaccine - received 7/10 and 7/31  Gustavo Lah, CNM 10/20/19 6:11 PM

## 2019-10-20 NOTE — Progress Notes (Signed)
The risks of cesarean section discussed with the patient included but were not limited to: bleeding which may require transfusion or reoperation; infection which may require antibiotics; injury to bowel, bladder, ureters or other surrounding organs; injury to the fetus; need for additional procedures including hysterectomy in the event of a life-threatening hemorrhage; placental abnormalities wth subsequent pregnancies, incisional problems, thromboembolic phenomenon and other postoperative/anesthesia complications. The patient concurred with the proposed plan, giving informed written consent for the procedure.   Patient has been NPO since 1pm and she will remain NPO for procedure. Anesthesia and OR aware. Preoperative prophylactic antibiotics and SCDs ordered on call to the OR.  To OR when ready.

## 2019-10-20 NOTE — Anesthesia Procedure Notes (Signed)
Spinal  Patient location during procedure: OR Staffing Performed: resident/CRNA  Anesthesiologist: Tera Mater, MD Resident/CRNA: Rolla Plate, CRNA Preanesthetic Checklist Completed: patient identified, IV checked, site marked, risks and benefits discussed, surgical consent, monitors and equipment checked, pre-op evaluation and timeout performed Spinal Block Patient position: sitting Prep: ChloraPrep and site prepped and draped Patient monitoring: heart rate, continuous pulse ox, blood pressure and cardiac monitor Approach: midline Location: L4-5 Injection technique: single-shot Needle Needle type: Whitacre and Introducer  Needle gauge: 24 G Needle length: 9 cm Assessment Sensory level: T4 Additional Notes Negative paresthesia. Negative blood return. Positive free-flowing CSF. Expiration date of kit checked and confirmed. Patient tolerated procedure well, without complications.

## 2019-10-20 NOTE — Transfer of Care (Signed)
Immediate Anesthesia Transfer of Care Note  Patient: Brianna Brown  Procedure(s) Performed: Repeat CESAREAN SECTION  Patient Location: Mother/Baby  Anesthesia Type:Spinal  Level of Consciousness: awake, alert  and oriented  Airway & Oxygen Therapy: Patient Spontanous Breathing  Post-op Assessment: Report given to RN and Post -op Vital signs reviewed and stable  Post vital signs: Reviewed  Last Vitals:  Vitals Value Taken Time  BP 95/73 10/20/19 2150  Temp    Pulse 80 10/20/19 2150  Resp 16 10/20/19 2150  SpO2 100 % 10/20/19 2150    Last Pain:  Vitals:   10/20/19 1928  TempSrc: Oral  PainSc:          Complications: No complications documented.

## 2019-10-20 NOTE — Op Note (Signed)
  Cesarean Section Procedure Note  Date of procedure: 10/20/2019   Pre-operative Diagnosis: Intrauterine pregnancy at [redacted]w[redacted]d;  - preterm labor - prior cesarean section x3  Post-operative Diagnosis: same, delivered.  Procedure: Repeat Low Transverse Cesarean Section through Pfannenstiel incision  Surgeon: Christeen Douglas, MD  Assistant(s):  Margaretmary Eddy, CNM, Primus Bravo, PA-S  Anesthesia: Spinal anesthesia  Anesthesiologist: Karleen Hampshire, MD Anesthesiologist: Karleen Hampshire, MD CRNA: Mathews Argyle, CRNA  Estimated Blood Loss:  300 mL         Drains: foley cath         Total IV Fluids: see anesthesia   Urine Output: see anesthesia notes         Specimens: Cord blood for O+         Complications:  None; patient tolerated the procedure well.         Disposition: PACU - hemodynamically stable.         Condition: stable  Findings:  A female infant "[name]" in cephalic presentation. Amniotic fluid - Clear  Birth weight 6#8oz.  Apgars of 7 and 8 at one and five minutes respectively.  Intact placenta with a three-vessel cord.  Grossly normal uterus, tubes and ovaries bilaterally. Minimal intraabdominal adhesions were noted.  Indications: Preterm labor on prior hysterotomy x4  Procedure Details  The patient was taken to Operating Room, identified as the correct patient and the procedure verified as C-Section Delivery. A formal Time Out was held with all team members present and in agreement.  After induction of anesthesia, the patient was draped and prepped in the usual sterile manner. A Pfannenstiel skin incision was made and carried down through the subcutaneous tissue to the fascia. Fascial incision was made and extended transversely with the Mayo scissors. The fascia was separated from the underlying rectus tissue superiorly and inferiorly. The peritoneum was identified and entered bluntly. Peritoneal incision was extended longitudinally. The  utero-vesical peritoneal reflection was incised transversely and a bladder flap was created digitally.   A low transverse hysterotomy was made. The fetus was delivered atraumatically. The umbilical cord was clamped x2 and cut and the infant was handed to the awaiting pediatricians. The placenta was removed intact and appeared normal, intact, and with a 3-vessel cord.   The uterus was exteriorized and cleared of all clot and debris. The hysterotomy was closed with running sutures of 0-Vicryl. A second imbricating layer was placed with the same suture for hemostasis. Excellent hemostasis was observed. The peritoneal cavity was cleared of all clots and debris. The uterus was returned to the abdomen.   The pelvis was irrigated and again, excellent hemostasis was noted. The fascia was then reapproximated with running sutures of 0 Vicryl.  The subcutaneous tissue was reapproximated with running sutures of 0 Vicry. The skin was reapproximated with a Ensorb subcuticular stitch.  69ml (in 30 of 0.5% bupivicaine and 62ml of NSS) of liposomal bupivicaine placed in the fascial and skin lines.  Instrument, sponge, and needle counts were correct prior to the abdominal closure and at the conclusion of the case.   The patient tolerated the procedure well and was transferred to the recovery room in stable condition.   Christeen Douglas, MD 10/20/2019

## 2019-10-20 NOTE — Anesthesia Preprocedure Evaluation (Addendum)
Anesthesia Evaluation  Patient identified by MRN, date of birth, ID band Patient awake    Reviewed: Allergy & Precautions, H&P , NPO status , Patient's Chart, lab work & pertinent test results  Airway Mallampati: II  TM Distance: >3 FB     Dental  (+) Teeth Intact   Pulmonary asthma ,    breath sounds clear to auscultation       Cardiovascular Exercise Tolerance: Good (-) hypertensionnegative cardio ROS   Rhythm:regular Rate:Normal     Neuro/Psych PSYCHIATRIC DISORDERS Depression    GI/Hepatic negative GI ROS,   Endo/Other  Hypothyroidism   Renal/GU   negative genitourinary   Musculoskeletal   Abdominal   Peds  Hematology negative hematology ROS (+)   Anesthesia Other Findings Past Medical History: No date: Asthma     Comment:  Exercise-Induced No date: Depression No date: Hypothyroidism  Past Surgical History: No date: CESAREAN SECTION     Comment:  x's 3     Reproductive/Obstetrics (+) Pregnancy                            Anesthesia Physical Anesthesia Plan  ASA: II  Anesthesia Plan: Spinal   Post-op Pain Management:    Induction:   PONV Risk Score and Plan:   Airway Management Planned:   Additional Equipment:   Intra-op Plan:   Post-operative Plan:   Informed Consent: I have reviewed the patients History and Physical, chart, labs and discussed the procedure including the risks, benefits and alternatives for the proposed anesthesia with the patient or authorized representative who has indicated his/her understanding and acceptance.     Dental Advisory Given  Plan Discussed with: Anesthesiologist  Anesthesia Plan Comments:         Anesthesia Quick Evaluation

## 2019-10-21 ENCOUNTER — Encounter: Payer: Self-pay | Admitting: Obstetrics and Gynecology

## 2019-10-21 LAB — CBC
HCT: 32.3 % — ABNORMAL LOW (ref 36.0–46.0)
Hemoglobin: 11.1 g/dL — ABNORMAL LOW (ref 12.0–15.0)
MCH: 29.1 pg (ref 26.0–34.0)
MCHC: 34.4 g/dL (ref 30.0–36.0)
MCV: 84.8 fL (ref 80.0–100.0)
Platelets: 220 10*3/uL (ref 150–400)
RBC: 3.81 MIL/uL — ABNORMAL LOW (ref 3.87–5.11)
RDW: 12.3 % (ref 11.5–15.5)
WBC: 23.3 10*3/uL — ABNORMAL HIGH (ref 4.0–10.5)
nRBC: 0 % (ref 0.0–0.2)

## 2019-10-21 LAB — RPR: RPR Ser Ql: NONREACTIVE

## 2019-10-21 MED ORDER — LACTATED RINGERS IV SOLN: INTRAVENOUS | Status: DC

## 2019-10-21 MED ORDER — ACETAMINOPHEN 500 MG PO TABS
1000.0000 mg | ORAL_TABLET | Freq: Four times a day (QID) | ORAL | Status: DC
  Administered 2019-10-21 – 2019-10-22 (×7): 1000 mg via ORAL
  Filled 2019-10-21 (×8): qty 2

## 2019-10-21 MED ORDER — SIMETHICONE 80 MG PO CHEW: 80.0000 mg | CHEWABLE_TABLET | ORAL | Status: DC | PRN

## 2019-10-21 MED ORDER — COCONUT OIL OIL
1.0000 "application " | TOPICAL_OIL | Status: DC | PRN
  Administered 2019-10-21: 1 via TOPICAL
  Filled 2019-10-21: qty 120

## 2019-10-21 MED ORDER — FERROUS SULFATE 325 (65 FE) MG PO TABS
325.0000 mg | ORAL_TABLET | Freq: Two times a day (BID) | ORAL | Status: DC
  Administered 2019-10-21 – 2019-10-23 (×6): 325 mg via ORAL
  Filled 2019-10-21 (×6): qty 1

## 2019-10-21 MED ORDER — OXYCODONE HCL 5 MG PO TABS
5.0000 mg | ORAL_TABLET | ORAL | Status: DC | PRN
  Administered 2019-10-22: 5 mg via ORAL
  Administered 2019-10-22: 10 mg via ORAL
  Administered 2019-10-23: 5 mg via ORAL
  Administered 2019-10-23 (×3): 10 mg via ORAL
  Filled 2019-10-21: qty 2
  Filled 2019-10-21: qty 1
  Filled 2019-10-21 (×3): qty 2
  Filled 2019-10-21: qty 1

## 2019-10-21 MED ORDER — SERTRALINE HCL 100 MG PO TABS
100.0000 mg | ORAL_TABLET | Freq: Every day | ORAL | Status: DC
  Administered 2019-10-21 – 2019-10-23 (×3): 100 mg via ORAL
  Filled 2019-10-21 (×3): qty 1

## 2019-10-21 MED ORDER — IBUPROFEN 800 MG PO TABS
800.0000 mg | ORAL_TABLET | Freq: Four times a day (QID) | ORAL | Status: DC
  Administered 2019-10-21 – 2019-10-23 (×8): 800 mg via ORAL
  Filled 2019-10-21 (×8): qty 1

## 2019-10-21 MED ORDER — MENTHOL 3 MG MT LOZG
1.0000 | LOZENGE | OROMUCOSAL | Status: DC | PRN
  Filled 2019-10-21: qty 9

## 2019-10-21 MED ORDER — WITCH HAZEL-GLYCERIN EX PADS: 1.0000 "application " | MEDICATED_PAD | CUTANEOUS | Status: DC | PRN

## 2019-10-21 MED ORDER — GABAPENTIN 300 MG PO CAPS
300.0000 mg | ORAL_CAPSULE | Freq: Every day | ORAL | Status: DC
  Administered 2019-10-21 – 2019-10-22 (×2): 300 mg via ORAL
  Filled 2019-10-21 (×2): qty 1

## 2019-10-21 MED ORDER — SIMETHICONE 80 MG PO CHEW
80.0000 mg | CHEWABLE_TABLET | Freq: Three times a day (TID) | ORAL | Status: DC
  Administered 2019-10-21 – 2019-10-23 (×7): 80 mg via ORAL
  Filled 2019-10-21 (×6): qty 1

## 2019-10-21 MED ORDER — DIBUCAINE (PERIANAL) 1 % EX OINT: 1.0000 "application " | TOPICAL_OINTMENT | CUTANEOUS | Status: DC | PRN

## 2019-10-21 MED ORDER — OXYTOCIN-SODIUM CHLORIDE 30-0.9 UT/500ML-% IV SOLN
2.5000 [IU]/h | INTRAVENOUS | Status: AC
  Administered 2019-10-21: 2.5 [IU]/h via INTRAVENOUS
  Filled 2019-10-21: qty 500

## 2019-10-21 MED ORDER — PRENATAL MULTIVITAMIN CH
1.0000 | ORAL_TABLET | Freq: Every day | ORAL | Status: DC
  Administered 2019-10-21 – 2019-10-23 (×3): 1 via ORAL
  Filled 2019-10-21 (×3): qty 1

## 2019-10-21 MED ORDER — SENNOSIDES-DOCUSATE SODIUM 8.6-50 MG PO TABS
2.0000 | ORAL_TABLET | ORAL | Status: DC
  Administered 2019-10-21 – 2019-10-23 (×3): 2 via ORAL
  Filled 2019-10-21 (×3): qty 2

## 2019-10-21 MED ORDER — FLEET ENEMA 7-19 GM/118ML RE ENEM: 1.0000 | ENEMA | Freq: Every day | RECTAL | Status: DC | PRN

## 2019-10-21 MED ORDER — LEVOTHYROXINE SODIUM 25 MCG PO TABS
25.0000 ug | ORAL_TABLET | Freq: Every day | ORAL | Status: DC
  Administered 2019-10-21 – 2019-10-23 (×3): 25 ug via ORAL
  Filled 2019-10-21 (×4): qty 1

## 2019-10-21 MED ORDER — SIMETHICONE 80 MG PO CHEW
80.0000 mg | CHEWABLE_TABLET | ORAL | Status: DC
  Administered 2019-10-21 – 2019-10-23 (×2): 80 mg via ORAL
  Filled 2019-10-21 (×3): qty 1

## 2019-10-21 MED ORDER — MEASLES, MUMPS & RUBELLA VAC IJ SOLR
0.5000 mL | Freq: Once | INTRAMUSCULAR | Status: DC
  Filled 2019-10-21: qty 0.5

## 2019-10-21 MED ORDER — DIPHENHYDRAMINE HCL 25 MG PO CAPS: 25.0000 mg | ORAL_CAPSULE | Freq: Four times a day (QID) | ORAL | Status: DC | PRN

## 2019-10-21 MED ORDER — KETOROLAC TROMETHAMINE 30 MG/ML IJ SOLN
30.0000 mg | Freq: Four times a day (QID) | INTRAMUSCULAR | Status: AC
  Administered 2019-10-21 (×3): 30 mg via INTRAVENOUS
  Filled 2019-10-21 (×3): qty 1

## 2019-10-21 MED ORDER — BISACODYL 10 MG RE SUPP: 10.0000 mg | Freq: Every day | RECTAL | Status: DC | PRN

## 2019-10-21 MED ORDER — TETANUS-DIPHTH-ACELL PERTUSSIS 5-2.5-18.5 LF-MCG/0.5 IM SUSP: 0.5000 mL | Freq: Once | INTRAMUSCULAR | Status: DC

## 2019-10-21 NOTE — Anesthesia Post-op Follow-up Note (Signed)
  Anesthesia Pain Follow-up Note  Patient: Brianna Brown  Day #: 1  Date of Follow-up: 10/21/2019 Time: 8:05 AM  Last Vitals:  Vitals:   10/21/19 0439 10/21/19 0726  BP: 101/66 94/63  Pulse: 65 67  Resp: 20 18  Temp:  36.9 C  SpO2: 98% 99%    Level of Consciousness: alert  Pain: none   Side Effects:None  Catheter Site Exam:clean, dry     Plan: D/C from anesthesia care at surgeon's request  Rosanne Gutting

## 2019-10-21 NOTE — Anesthesia Postprocedure Evaluation (Signed)
Anesthesia Post Note  Patient: Khrystyne Arpin  Procedure(s) Performed: Repeat CESAREAN SECTION  Patient location during evaluation: Mother Baby Anesthesia Type: Spinal Level of consciousness: oriented and awake and alert Pain management: pain level controlled Vital Signs Assessment: post-procedure vital signs reviewed and stable Respiratory status: spontaneous breathing and respiratory function stable Cardiovascular status: blood pressure returned to baseline and stable Postop Assessment: no headache, no backache, no apparent nausea or vomiting and able to ambulate Anesthetic complications: no   No complications documented.   Last Vitals:  Vitals:   10/21/19 0439 10/21/19 0726  BP: 101/66 94/63  Pulse: 65 67  Resp: 20 18  Temp:  36.9 C  SpO2: 98% 99%    Last Pain:  Vitals:   10/21/19 0726  TempSrc: Oral  PainSc:                  Rosanne Gutting

## 2019-10-21 NOTE — Lactation Note (Signed)
Pt pumped breasts in initiation mode and obtained 8 cc colostrum, bottle labeled and taken to SCN and placed in breast milk refrig.  I gave mom a basin and [employer] dish soap and demonstrated cleaning pump parts,  Parts left to air dry, mom has a breast pump for use at home.

## 2019-10-21 NOTE — Progress Notes (Signed)
Post Partum Day 1 Subjective: Doing well, no complaints.  Tolerating regular diet, pain with PO meds, voiding and ambulating without difficulty.  No CP SOB Fever,Chills, N/V or leg pain; denies nipple or breast pain, no HA change of vision, RUQ/epigastric pain  Objective: BP (!) 89/66   Pulse (!) 52   Temp 98.4 F (36.9 C) (Oral)   Resp 18   Ht 5\' 3"  (1.6 m)   Wt 64.9 kg   LMP 02/15/2019   SpO2 100%   Breastfeeding Unknown   BMI 25.33 kg/m    Physical Exam:  General: NAD Breasts: soft/nontender CV: RRR Pulm: nl effort, CTABL Abdomen: soft, NT, BS x 4 Incision: Pressure Dsg CDI Lochia: moderate Uterine Fundus: fundus firm and 2 fb below umbilicus DVT Evaluation: no cords, ttp LEs   Recent Labs    10/20/19 1830 10/21/19 0620  HGB 10.2* 11.1*  HCT 30.5* 32.3*  WBC 11.2* 23.3*  PLT 219 220    Assessment/Plan: 44 y.o. 44 postpartum day # 1  - Continue routine PP care, baby in ICN, emotional support given.  - encouraged pt to ambulate, may shower and remove pressure dressing at 24hrs, will apply honeycomb dsg.  - Lactation consult.  - plans spouse vasectomy - Acute blood loss anemia - hemodynamically stable and asymptomatic; start po ferrous sulfate BID with stool softeners  - Immunization status:  all Imms up to date    Disposition: Does not desire Dc home today.     [id number], CNM 10/21/2019  5:07 PM

## 2019-10-21 NOTE — Discharge Instructions (Signed)
Discharge Instructions:   Follow-up Appointment: Monday, August 30th at 3:45pm with Dr. Dalbert Garnet at [facility]   If there are any new medications, they have been ordered and will be available for pickup at the listed pharmacy on your way home from the hospital.   Call office if you have any of the following: headache, visual changes, fever >101.0 F, chills, shortness of breath, breast concerns, excessive vaginal bleeding, incision drainage or problems, leg pain or redness, depression or any other concerns. If you have vaginal discharge with an odor, let your doctor know.   It is normal to bleed for up to 6 weeks. You should not soak through more than 1 pad in 1 hour. If you have a blood clot larger than your fist with continued bleeding, call your doctor.   After a c-section, you should expect a small amount of blood or clear fluid coming from the incision and abdominal cramping/soreness. Inspect your incision site daily. Stand in front of a mirror to look for any redness, incision opening, or discolored/odorness drainage. Take a shower daily and continue good hygiene. Use own towel and washcloth (do not share). Make sure your sheets on your bed are clean. No pets sleeping around your incision site. Dressing will be removed at your postpartum visit. If the dressing does become wet or soiled underneath, it is okay to remove it.   On-Q pump: You will remove on day 5 after insertion or if the ball becomes flat before day 5. You will remove on: Jul 11, 2018  Activity: Do not lift > 10 lbs for 6 weeks (do not lift anything heavier than your baby). No intercourse, tampons, swimming pools, hot tubs, baths (only showers) for 6 weeks.  No driving for 1-2 weeks. Continue prenatal vitamin, especially if breastfeeding. Increase calories and fluids (water) while breastfeeding.   Your milk will come in, in the next couple of days (right now it is colostrum). You may have a slight fever when your milk comes  in, but it should go away on its own.  If it does not, and rises above 101 F please call the doctor. You will also feel achy and your breasts will be firm. They will also start to leak. If you are breastfeeding, continue as you have been and you can pump/express milk for comfort.   If you have too much milk, your breasts can become engorged, which could lead to mastitis. This is an infection of the milk ducts. It can be very painful and you will need to notify your doctor to obtain a prescription for antibiotics. You can also treat it with a shower or hot/cold compress.   For concerns about your baby, please call your pediatrician.  For breastfeeding concerns, the lactation consultant can be reached at [phone].   Postpartum blues (feelings of happy one minute and sad another minute) are normal for the first few weeks but if it gets worse let your doctor know.   Congratulations! We enjoyed caring for you and your new bundle of joy!   Cesarean Delivery, Care After This sheet gives you information about how to care for yourself after your procedure. Your health care provider may also give you more specific instructions. If you have problems or questions, contact your health care provider. What can I expect after the procedure? After the procedure, it is common to have:  A small amount of blood or clear fluid coming from the incision.  Some redness, swelling, and pain in your  incision area.  Some abdominal pain and soreness.  Vaginal bleeding (lochia). Even though you did not have a vaginal delivery, you will still have vaginal bleeding and discharge.  Pelvic cramps.  Fatigue. You may have pain, swelling, and discomfort in the tissue between your vagina and your anus (perineum) if:  Your C-section was unplanned, and you were allowed to labor and push.  An incision was made in the area (episiotomy) or the tissue tore during attempted vaginal delivery. Follow these instructions at  home: Incision care   Follow instructions from your health care provider about how to take care of your incision. Make sure you: ? Wash your hands with soap and water before you change your bandage (dressing). If soap and water are not available, use hand sanitizer. ? If you have a dressing, change it or remove it as told by your health care provider. ? Leave stitches (sutures), skin staples, skin glue, or adhesive strips in place. These skin closures may need to stay in place for 2 weeks or longer. If adhesive strip edges start to loosen and curl up, you may trim the loose edges. Do not remove adhesive strips completely unless your health care provider tells you to do that.  Check your incision area every day for signs of infection. Check for: ? More redness, swelling, or pain. ? More fluid or blood. ? Warmth. ? Pus or a bad smell.  Do not take baths, swim, or use a hot tub until your health care provider says it's okay. Ask your health care provider if you can take showers.  When you cough or sneeze, hug a pillow. This helps with pain and decreases the chance of your incision opening up (dehiscing). Do this until your incision heals. Medicines  Take over-the-counter and prescription medicines only as told by your health care provider.  If you were prescribed an antibiotic medicine, take it as told by your health care provider. Do not stop taking the antibiotic even if you start to feel better.  Do not drive or use heavy machinery while taking prescription pain medicine. Lifestyle  Do not drink alcohol. This is especially important if you are breastfeeding or taking pain medicine.  Do not use any products that contain nicotine or tobacco, such as cigarettes, e-cigarettes, and chewing tobacco. If you need help quitting, ask your health care provider. Eating and drinking  Drink at least 8 eight-ounce glasses of water every day unless told not to by your health care provider. If you  breastfeed, you may need to drink even more water.  Eat high-fiber foods every day. These foods may help prevent or relieve constipation. High-fiber foods include: ? Whole grain cereals and breads. ? Brown rice. ? Beans. ? Fresh fruits and vegetables. Activity   If possible, have someone help you care for your baby and help with household activities for at least a few days after you leave the hospital.  Return to your normal activities as told by your health care provider. Ask your health care provider what activities are safe for you.  Rest as much as possible. Try to rest or take a nap while your baby is sleeping.  Do not lift anything that is heavier than 10 lbs (4.5 kg), or the limit that you were told, until your health care provider says that it is safe.  Talk with your health care provider about when you can engage in sexual activity. This may depend on your: ? Risk of infection. ? How  fast you heal. ? Comfort and desire to engage in sexual activity. General instructions  Do not use tampons or douches until your health care provider approves.  Wear loose, comfortable clothing and a supportive and well-fitting bra.  Keep your perineum clean and dry. Wipe from front to back when you use the toilet.  If you pass a blood clot, save it and call your health care provider to discuss. Do not flush blood clots down the toilet before you get instructions from your health care provider.  Keep all follow-up visits for you and your baby as told by your health care provider. This is important. Contact a health care provider if:  You have: ? A fever. ? Bad-smelling vaginal discharge. ? Pus or a bad smell coming from your incision. ? Difficulty or pain when urinating. ? A sudden increase or decrease in the frequency of your bowel movements. ? More redness, swelling, or pain around your incision. ? More fluid or blood coming from your incision. ? A rash. ? Nausea. ? Little or no  interest in activities you used to enjoy. ? Questions about caring for yourself or your baby.  Your incision feels warm to the touch.  Your breasts turn red or become painful or hard.  You feel unusually sad or worried.  You vomit.  You pass a blood clot from your vagina.  You urinate more than usual.  You are dizzy or light-headed. Get help right away if:  You have: ? Pain that does not go away or get better with medicine. ? Chest pain. ? Difficulty breathing. ? Blurred vision or spots in your vision. ? Thoughts about hurting yourself or your baby. ? New pain in your abdomen or in one of your legs. ? A severe headache.  You faint.  You bleed from your vagina so much that you fill more than one sanitary pad in one hour. Bleeding should not be heavier than your heaviest period. Summary  After the procedure, it is common to have pain at your incision site, abdominal cramping, and slight bleeding from your vagina.  Check your incision area every day for signs of infection.  Tell your health care provider about any unusual symptoms.  Keep all follow-up visits for you and your baby as told by your health care provider. This information is not intended to replace advice given to you by your health care provider. Make sure you discuss any questions you have with your health care provider. Document Revised: 09/02/2017 Document Reviewed: 09/02/2017 Elsevier Patient Education  2020 [employer].

## 2019-10-22 NOTE — Social Work (Signed)
TOC CM/SW receive call from nursing regarding social work consult.  Positive Edinburgh. SW will assess patient at about 1330

## 2019-10-22 NOTE — TOC Progression Note (Addendum)
Transition of Care [facility]) - Progression Note    Patient Details  Name: Brianna Brown MRN: [id number] Date of Birth: 17-Nov-1975  Transition of Care [facility]) CM/SW Contact  Larwance Rote, LCSW Phone Number: 10/22/2019, 1:56 PM  Clinical Narrative:    The patient is a 44 year old female who had positive Edinburgh depression screening.  Patient gave birth to a baby girl on October 20, 2019,  [name]. Patient reported that she has been feeling depress because her daughter was 5 weeks premature and required medical attention.  She reported that she is the mother of a 89-, 2-, and 46-year-old boys.  Noted that "this is my baby girl."  Patient reported that she has a diagnosis of clinical depression and anorexia.  Patient also reports history of postpartum depression with 3rd child.   Patient's primary physician is Nettie Elm, PA, [facility]. Was meeting with a therapist at [facility].  Stated that is going to make a follow-up appointment to talk to the therapist.   She reported that her husband is a good support, and she can talk to him.  Noted that her husband will be working from home and is able to assist her.  Social worker discussed with the patient signs of postpartum depression and also provided strategies. Discussed the use of community resources. Patient demonstrated good understanding.      Current discharge risk: No risk assess during this session. Patient denied SI/HI - denied AH/VH. Oriented x4.   Barriers to discharge: Patient can safely be discharged. She is going to follow-up with her primary care provider, [facility], [place], [place]

## 2019-10-22 NOTE — Plan of Care (Signed)
Alert and oriented with pleasant affect. VSS and Assessment WNL. Denies c/o. Lower abdominal incision well approximated and without s/s complications. Ambulating with steady gait. Visits Infant at regular intervals in SCN to Breat feed.

## 2019-10-22 NOTE — Progress Notes (Signed)
Post Partum Day 2 Subjective: Doing well, no complaints.  Tolerating regular diet, pain with PO meds, voiding and ambulating without difficulty. Feeling less anxious today, did not shower last evening, decided to rest instead.   No CP SOB Fever,Chills, N/V or leg pain; denies nipple or breast pain, no HA change of vision, RUQ/epigastric pain  Objective: BP 100/65 (BP Location: Right Arm)   Pulse 71   Temp 98.4 F (36.9 C) (Oral)   Resp 16   Ht 5\' 3"  (1.6 m)   Wt 64.9 kg   LMP 02/15/2019   SpO2 98%   Breastfeeding Unknown   BMI 25.33 kg/m    Physical Exam:  General: NAD Breasts: soft/nontender CV: RRR Pulm: nl effort, CTABL Abdomen: soft, NT, BS x 4 Incision: Pressure Dsg CDI Lochia: moderate Uterine Fundus: fundus firm and 2 fb below umbilicus DVT Evaluation: no cords, ttp LEs   Recent Labs    10/20/19 1830 10/21/19 0620  HGB 10.2* 11.1*  HCT 30.5* 32.3*  WBC 11.2* 23.3*  PLT 219 220    Assessment/Plan: 44 y.o. 59 postpartum day # 2  - Continue routine PP care, baby in ICN, emotional support given.  - encouraged pt to ambulate, may shower and remove pressure dressing, will apply honeycomb dsg.  - Lactation consult prn  - plans spouse vasectomy - Immunization status:  all Imms up to date    Disposition: Does not desire Dc home today.     [id number], CNM 10/22/2019  11:18 AM

## 2019-10-22 NOTE — Lactation Note (Signed)
This note was copied from a baby's chart. Lactation Consultation Note  Patient Name: Brianna Jamey Brown HUDJS'H Date: 10/22/2019 Reason for consult: Follow-up assessment;Mother's request;NICU baby;Late-preterm 34-36.6wks;Nipple pain/trauma;Other (Comment) (Breast feeding in SCN - Apnea with bottles - not with BF)  Assisted mom with pillow support in chair in SCN in football hold skin to skin.  Mom has flattish nipples that evert with compression.  Tried in cradle hold on left breast at first.  [name] was fussy.  Switched to football hold on right breast.  After a couple of attempts, she latched and began strong, rhythmic sucking with audible swallows for 20 minutes.  She came off a few times slipping to the tip of the nipple and had to reposition.  No apnea noted and oxygen saturations remained within normal limits.  Mom reports a little nipple tenderness on initial latch that eases up into feeding.  Nipples are red.  Coconut oil has been given.  Comfort gels placed in refrigerator and instructed in alternating use.  Hand expressed some colostrum after breast feeding and rubbed on nipple to prevent bacteria, lubricate and help with discomfort.  Mom breast fed other 3 for around 9 months each.  With her second who was tongue tied, she got mastitis.  Mom concerned that she is not able to pump a lot.  Explained this was normal when using breast pump and that she was getting more than most moms get the first couple of days of pumping.  Hand out given on what to expect with breast feeding the first 4 days of life and reviewed normal newborn stomach size, supply and demand, normal course of lactation and routine newborn feeding patterns.  Mom getting ready to take a shower.  Encouraged mom to call for comfort gels after shower.  Lactation [employer] given and reviewed.  Lactation name and number written on white board and encouraged to call with any questions, concerns or  assistance. Maternal Data Formula Feeding for Exclusion: No Has patient been taught Hand Expression?: Yes Does the patient have breastfeeding experience prior to this delivery?: Yes  Feeding Feeding Type: Breast Fed  LATCH Score Latch: Repeated attempts needed to sustain latch, nipple held in mouth throughout feeding, stimulation needed to elicit sucking reflex.  Audible Swallowing: Spontaneous and intermittent  Type of Nipple: Everted at rest and after stimulation  Comfort (Breast/Nipple): Filling, red/small blisters or bruises, mild/mod discomfort  Hold (Positioning): Assistance needed to correctly position infant at breast and maintain latch.  LATCH Score: 7  Interventions Interventions: Breast feeding basics reviewed;Assisted with latch;Skin to skin;Breast massage;Hand express;Reverse pressure;Breast compression;Adjust position;Support pillows;Position options;Coconut oil;Comfort gels  Lactation Tools Discussed/Used Tools: Coconut oil;Comfort gels WIC Program: No ([employer]) Pump Review: Setup, frequency, and cleaning;Milk Storage;Other (comment)   Consult Status Consult Status: Follow-up Date: 10/22/19 Follow-up type: Call as needed    Louis Meckel 10/22/2019, 3:32 PM

## 2019-10-23 MED ORDER — IBUPROFEN 800 MG PO TABS: 800.0000 mg | ORAL_TABLET | Freq: Four times a day (QID) | ORAL | 0 refills | Status: DC

## 2019-10-23 MED ORDER — ACETAMINOPHEN 325 MG PO TABS: 325.0000 mg | ORAL_TABLET | Freq: Four times a day (QID) | ORAL | 0 refills | Status: DC

## 2019-10-23 MED ORDER — HYDROCORTISONE 1 % EX OINT: 1.0000 "application " | TOPICAL_OINTMENT | Freq: Two times a day (BID) | CUTANEOUS | 0 refills | Status: DC

## 2019-10-23 MED ORDER — OXYCODONE-ACETAMINOPHEN 5-325 MG PO TABS: 1.0000 | ORAL_TABLET | Freq: Four times a day (QID) | ORAL | 0 refills | Status: AC | PRN

## 2019-10-23 MED ORDER — SENNOSIDES-DOCUSATE SODIUM 8.6-50 MG PO TABS: 2.0000 | ORAL_TABLET | ORAL | 0 refills | Status: DC

## 2019-10-23 NOTE — Progress Notes (Signed)
Reassessed Pt. after c/o feeling cold at 0235. VSS assessment wnl. Pt. States she feels "'Better". Roxicodone given for c/o pain since Pt. Cannot have scheduled Tylenol due to having received maximum amount  in 24 hours.

## 2019-10-23 NOTE — Progress Notes (Signed)
Pt. D/C to Home. Wheeled in wheel chair by C. Cevallos RN to husband waiting in car in [employer] Pt. V/o of all D/C Instructions and F/U appointments. VSS and Assessment WNL. Denies c/o. Appears comfortable and in NAD.

## 2019-10-23 NOTE — Discharge Summary (Signed)
Obstetrical Discharge Summary  Patient Name: Brianna Brown DOB: 1975-11-21 MRN: [id number]  Date of Admission: 10/20/2019 Date of Delivery: 10/20/19 Delivered by: Dalbert Garnet MD Date of Discharge: 10/23/2019  Primary OB: [facility] QBH:[id number] last menstrual period was 02/15/2019. EDC Estimated Date of Delivery: 11/22/19 Gestational Age at Delivery: [redacted]w[redacted]d   Antepartum complications:  1. Previous CS x 3 2. Advanced maternal age, 44yo 3. 1st child has achondroplasia 4. Hx bulging discs- LBP  5. Hx preterm birth with G3 at 35wks, G2 at 24wks 6. Depression, stable on Zoloft 100mg  daily 7. Hypothyroidism: on levothyroxine daily 8. Hx vasovagal responses and low BP, usually 80-90/40-50s.  9. Preterm contractions at 32.3wks, FFN neg 09/30/19 10. S/p fall on abdomen 7/25, neg KB  Admitting Diagnosis: Preterm labor, 35.2wks  Secondary Diagnosis: repeat LTCS  Patient Active Problem List   Diagnosis Date Noted  . Preterm contractions 10/20/2019  . Preterm labor 10/20/2019  . History of preterm labor 10/10/2019  . Exercise-induced asthma 02/26/2017  . Hypothyroidism 02/26/2017  . History of anorexia nervosa 02/26/2017    Augmentation: N/A Complications: None Intrapartum complications/course: preterm labor at 35.2 wks with cervical change, decision to proceed to repeat CS.  Date of Delivery: 10/20/19 Delivered By: 12/20/19 MD Delivery Type: repeat cesarean section, low transverse incision Anesthesia: spinal Placenta: manual Laceration: none Episiotomy: none Newborn Data: Live born female "KatieScarlett" Birth Weight: 6 lb 8.1 oz (2950 g) APGAR: 7, 8  Newborn Delivery   Birth date/time: 10/20/2019 20:48:00 Delivery type: C-Section, Low Transverse Trial of labor: No C-section categorization: Repeat      Postpartum Procedures: none  Edinburgh:  Edinburgh Postnatal Depression Scale Screening Tool 10/21/2019 10/21/2019  I have been able to laugh and see the  funny side of things. 0 (No Data)  I have looked forward with enjoyment to things. 0 -  I have blamed myself unnecessarily when things went wrong. 1 -  I have been anxious or worried for no good reason. 1 -  I have felt scared or panicky for no good reason. 2 -  Things have been getting on top of me. 1 -  I have been so unhappy that I have had difficulty sleeping. 2 -  I have felt sad or miserable. 1 -  I have been so unhappy that I have been crying. 1 -  The thought of harming myself has occurred to me. 0 -  Edinburgh Postnatal Depression Scale Total 9 -      Post partum course: Cesarean Section:  Patient had an uncomplicated postpartum course.  By time of discharge on POD#3, her pain was controlled on oral pain medications; she had appropriate lochia and was ambulating, voiding without difficulty, tolerating regular diet and passing flatus.   She was deemed stable for discharge to home.    Discharge Physical Exam:  BP 105/68 (BP Location: Right Arm)   Pulse 71   Temp 98 F (36.7 C) (Oral)   Resp 18   Ht 5\' 3"  (1.6 m)   Wt 64.9 kg   LMP 02/15/2019   SpO2 98%   Breastfeeding Unknown   BMI 25.33 kg/m   General: NAD CV: RRR Pulm: CTABL, nl effort ABD: s/nd/nt, fundus firm and below the umbilicus Lochia: moderate Incision: c/d/i, healing well, no significant drainage, no dehiscence, no significant erythema DVT Evaluation: LE non-ttp, no evidence of DVT on exam.  Hemoglobin  Date Value Ref Range Status  10/21/2019 11.1 (L) 12.0 - 15.0 g/dL Final  14/10/2018 10/23/2019 11.1 -  15.9 g/dL Final   HCT  Date Value Ref Range Status  10/21/2019 32.3 (L) 36 - 46 % Final   Hematocrit  Date Value Ref Range Status  03/12/2017 40.3 34.0 - 46.6 % Final     Disposition: stable, discharge to home. Baby Feeding: breastmilk Baby Disposition: SCN d/t prematurity  Rh Immune globulin given: n/a Rubella vaccine given: immune Varicella vaccine given: immune Tdap vaccine given in AP or PP  setting: 09/02/19 Flu vaccine given in AP or PP setting: n/a  Contraception: planned spouse vasectomy  Prenatal Labs:   Blood type/Rh O pos  Antibody screen neg  Rubella Immune  Varicella Immune  RPR NR  HBsAg Neg  HIV NR  GC neg  Chlamydia neg  Genetic screening Negative cfDNA and AFP   1 hour GTT 117  3 hour GTT N/A  GBS Negative 10/09/19     Plan:  Brianna Brown was discharged to home in good condition. Follow-up appointment with delivering provider in 2 weeks.  Discharge Medications: Allergies as of 10/23/2019   No Known Allergies     Medication List    STOP taking these medications   cyclobenzaprine 5 MG tablet Commonly known as: FLEXERIL     TAKE these medications   acetaminophen 325 MG tablet Commonly known as: TYLENOL Take 1 tablet (325 mg total) by mouth every 6 (six) hours. What changed:   medication strength  how much to take  when to take this  reasons to take this   hydrocortisone 1 % ointment Apply 1 application topically 2 (two) times daily.   ibuprofen 800 MG tablet Commonly known as: ADVIL Take 1 tablet (800 mg total) by mouth every 6 (six) hours.   levothyroxine 25 MCG tablet Commonly known as: SYNTHROID Take 1/2 tablet by mouth daily.   multivitamin-prenatal 27-0.8 MG Tabs tablet Take 1 tablet by mouth daily at 12 noon.   oxyCODONE-acetaminophen 5-325 MG tablet Commonly known as: Percocet Take 1 tablet by mouth every 6 (six) hours as needed for up to 7 days for severe pain.   senna-docusate 8.6-50 MG tablet Commonly known as: Senokot-S Take 2 tablets by mouth daily. Start taking on: October 24, 2019   sertraline 100 MG tablet Commonly known as: ZOLOFT Take 1 tablet (100 mg total) by mouth daily.        Follow-up Information    Christeen Douglas, MD. Go on 11/07/2019.   Specialty: Obstetrics and Gynecology Why: For postop check: Monday, August 30th at 3:45pm with Dr. Dalbert Garnet at [facility]!  Contact  information: [address] [place] [address] [phone]               Signed:  Randa Ngo, CNM 10/23/2019  9:50 AM

## 2019-10-23 NOTE — Progress Notes (Signed)
Post Partum Day 3 Subjective: Doing well, no complaints.  Tolerating regular diet, pain with PO meds, voiding and ambulating without difficulty.  Baby has been feeding well, remains in SCN due to prematurity, baby had a desat this am while breastfeeding.   No CP SOB Fever,Chills, N/V or leg pain; denies nipple or breast pain, no HA change of vision, RUQ/epigastric pain  Objective: BP 105/68 (BP Location: Right Arm)   Pulse 71   Temp 98 F (36.7 C) (Oral)   Resp 18   Ht 5\' 3"  (1.6 m)   Wt 64.9 kg   LMP 02/15/2019   SpO2 98%   Breastfeeding Unknown   BMI 25.33 kg/m    Physical Exam:  General: NAD Breasts: soft/nontender- filling.  CV: RRR Pulm: nl effort, CTABL Abdomen: soft, NT, BS x 4 Incision: bruising noted around incision, no erythema, no drainage, no dehiscence. Several areas of abrasion from tape noted.  Lochia: scant Uterine Fundus: fundus firm and 2 fb below umbilicus DVT Evaluation: no cords, ttp LEs   Recent Labs    10/20/19 1830 10/21/19 0620  HGB 10.2* 11.1*  HCT 30.5* 32.3*  WBC 11.2* 23.3*  PLT 219 220    Assessment/Plan: 44 y.o. 59 postpartum day # 3  - Continue routine PP care, baby in ICN, emotional support given.  - encouraged pt to ambulate - Lactation consult prn  - plans spouse vasectomy - Immunization status:  all Imms up to date    Disposition: Plan for late DC home this evening due to infant in SCN for prematurity.    [id number], CNM 10/23/2019  9:34 AM

## 2019-10-23 NOTE — Progress Notes (Signed)
Pt. With c/o feeling "cold" and not up to going to SCN to Breast Feed Infant. Alert and oriented with aprop. Affect. VSS: 99/62,77,20,97.9. Assessment intact. Room temperature increased and warm blanket provided. SCN notified. Will cont. To follow closely.

## 2019-10-23 NOTE — Progress Notes (Signed)
Discharge instructions, prescriptions, education, and appointments given and explained. Pt verbalized understanding with no further questions. Pt to remain in care until 6pm when husband arrives and after she feeds infant in [facility]N.

## 2019-10-23 NOTE — Lactation Note (Signed)
This note was copied from a baby's chart. Lactation Consultation Note  Patient Name: Brianna Brown Date: 10/23/2019   Joellen [place] had an apneic episode with color changes and deSats into the low 60's.  Now she is getting tube fed.  Re visited mom pumping and possibly putting her to an empty breast while gavage feeding is being given.  Reviewed breast massage, hand expression, pumping, collection, storage, cleaning, labeling and handling of her expressed milk.  Mom expressed almost 70 ml with first pumping.  Mom has a Medela DEBP at home that she got through [employer] benefits.  Praised mom for her commitment to continue to supply her healthy breast milk for Brianna Brown.  Mom will be discharged today, but plans to stay until late and then return in the morning.  Breast pumping supplies given to mom.  DEBP kit set up at [facility] bedside in [facility] so mom can continue to pump while she is visiting.  Lactation number given and encouraged to call with any questions, concerns or assistance.  Maternal Data    Feeding Feeding Type: Breast Milk  LATCH Score                   Interventions    Lactation Tools Discussed/Used     Consult Status      Jarold Motto 10/23/2019, 9:16 PM

## 2019-10-31 ENCOUNTER — Ambulatory Visit: Payer: Self-pay

## 2019-10-31 NOTE — Lactation Note (Signed)
This note was copied from a baby's chart. Lactation Consultation Note  Patient Name: Brianna Brown [id number] Date: 10/31/2019   Assisted mom with comfortable position with pillow support and nursing stool under her feet.  She started out in football hold skin to skin.  Mom has small breasts and areola.  Nipple is everted, but there has been a concern that [name] has not been taking in enough of the nipple.  It was suggested to try a nipple shield.  Pre breast feeding weight fully clothed with swaddle was 3218 gms (7 lb 1.5 oz).  #24 nipple shield applied.  She latched to the nipple shield with some reluctance, but was not doing much sucking.  Switched her from the football hold to the cradle hold which mom was more comfortable in and she asked that the nipple shield be removed.   [name] latched deeply with minimal assistance and began strong, vigorous rhythmic sucking with audible swallows for 5 to 6 minutes.  Mom reports that this has been the best breast feed in a while with lots more vigorous sucking.  After 5 to 6 minutes she started falling asleep at the breast and sucking ceased.  She remained at the breast with an occasional weak suck and swallow.  There was a possibility that she was still getting some milk from the breast since mom has such a good flow, but she was not swallowing and started choking.  During this choking episode, she had a brady down to 68, apneic spell and then the Sats dropped into the 70's.  Even after she was removed from the breast, she required vigorous stimulation to return to normal.  There was never a color change during this episode.  Post breast feeding weight was 3256 gms. So in that short period of actual sucking she took in 38 ml from the breast.  Encouraged mom to call lactation with any questions, concerns or assistance.      Maternal Data    Feeding Feeding Type: Breast Fed  LATCH Score Latch: Grasps breast easily, tongue down, lips flanged, rhythmical  sucking.  Audible Swallowing: Spontaneous and intermittent  Type of Nipple: Everted at rest and after stimulation  Comfort (Breast/Nipple): Soft / non-tender  Hold (Positioning): No assistance needed to correctly position infant at breast.  LATCH Score: 10  Interventions Interventions: Pre-pump if needed  Lactation Tools Discussed/Used     Consult Status      Louis Meckel 10/31/2019, 9:08 PM

## 2019-11-03 ENCOUNTER — Encounter (INDEPENDENT_AMBULATORY_CARE_PROVIDER_SITE_OTHER): Admitting: Vascular Surgery

## 2019-11-04 ENCOUNTER — Ambulatory Visit: Payer: Self-pay

## 2019-11-04 NOTE — Lactation Note (Signed)
This note was copied from a baby's chart. Lactation Consultation Note  Patient Name: Brianna Brown [id number] Date: 11/04/2019 Reason for consult: Follow-up assessment;NICU baby;Late-preterm 34-36.6wks;Infant weight loss;Other (Comment) (Has had apnea, brady & desats with some feedings)  Observed mom breast feeding [name] independently coordinating suck, swallow and breathing  without brady, apnea or desats for 18 minutes.  She is staying awake longer at the breast without choking episodes.  Mom is still pumping a sufficient amount to provide all breast milk feedings using her Medela pump she received through [employer].  Praised mom for her continued commitment to breast feed and provide [name] with her healthy breast milk.  Restocked mom with pumping supplies for home.  Encouraged mom to call with any questions, concerns or assistance. Maternal Data Formula Feeding for Exclusion: No Has patient been taught Hand Expression?: Yes Does the patient have breastfeeding experience prior to this delivery?: Yes  Feeding Feeding Type: Breast Fed  LATCH Score Latch: Grasps breast easily, tongue down, lips flanged, rhythmical sucking.  Audible Swallowing: Spontaneous and intermittent  Type of Nipple: Everted at rest and after stimulation  Comfort (Breast/Nipple): Soft / non-tender  Hold (Positioning): No assistance needed to correctly position infant at breast.  LATCH Score: 10  Interventions Interventions:  (Restocked with pumping supplies)  Lactation Tools Discussed/Used WIC Program: No Pump Review: Setup, frequency, and cleaning;Milk Storage;Other (comment) Initiated by:: S.Rondey Fallen,RN,BSN,IBCLC Date initiated:: 10/22/19   Consult Status Consult Status: PRN Follow-up type: Call as needed    Louis Meckel 11/04/2019, 8:10 PM

## 2019-11-14 ENCOUNTER — Other Ambulatory Visit: Payer: Self-pay

## 2019-11-14 ENCOUNTER — Telehealth: Payer: Self-pay | Admitting: Emergency Medicine

## 2019-11-14 ENCOUNTER — Emergency Department
Admission: EM | Admit: 2019-11-14 | Discharge: 2019-11-14 | Disposition: A | Discharge status: Home / Self Care | Attending: Emergency Medicine | Admitting: Emergency Medicine

## 2019-11-14 DIAGNOSIS — E039 Hypothyroidism, unspecified: Secondary | ICD-10-CM | POA: Diagnosis not present

## 2019-11-14 DIAGNOSIS — J45909 Unspecified asthma, uncomplicated: Secondary | ICD-10-CM | POA: Insufficient documentation

## 2019-11-14 DIAGNOSIS — R0981 Nasal congestion: Secondary | ICD-10-CM | POA: Diagnosis present

## 2019-11-14 DIAGNOSIS — Z79899 Other long term (current) drug therapy: Secondary | ICD-10-CM | POA: Insufficient documentation

## 2019-11-14 DIAGNOSIS — Z79890 Hormone replacement therapy (postmenopausal): Secondary | ICD-10-CM | POA: Insufficient documentation

## 2019-11-14 DIAGNOSIS — U071 COVID-19: Secondary | ICD-10-CM | POA: Insufficient documentation

## 2019-11-14 DIAGNOSIS — Z1152 Encounter for screening for COVID-19: Secondary | ICD-10-CM

## 2019-11-14 LAB — SARS CORONAVIRUS 2 BY RT PCR (HOSPITAL ORDER, PERFORMED IN ~~LOC~~ HOSPITAL LAB): SARS Coronavirus 2: POSITIVE — AB

## 2019-11-14 NOTE — ED Notes (Signed)
See triage note  Presents requesting a COVID test  States her husband is positive    States she a runny nose  No fever

## 2019-11-14 NOTE — Telephone Encounter (Signed)
Called patient to assure she is aware ov covid result.  I gave her positive result.  She says symptoms of nasal congestion stared 11/09/19.  She understands self isolation.

## 2019-11-14 NOTE — ED Triage Notes (Signed)
Pt states she needs a covid test, states she has a new born in NICU and her husband tested positive and she has been staying in a motel, states she has had a stuffy nose but no other sx at present.

## 2019-11-14 NOTE — ED Provider Notes (Signed)
[facility] Emergency Department Provider Note  ____________________________________________  Time seen: Approximately 9:27 AM  I have reviewed the triage vital signs and the nursing notes.   HISTORY  Chief Complaint Covid Exposure    HPI Brianna Brown is a 44 y.o. female that presents to the emergency department for a Covid test.  Patient had a baby 3 weeks ago that is in the NICU.  Her husband tested positive for Covid last week.  Patient had a negative Covid test 7 days ago.  She needs another Covid test to visit the NICU.  She has been staying in a hotel separated from her husband since he has tested positive.  After moving into the hotel, she did develop a little bit of nasal congestion but is unsure of whether this is just allergies.  No fever, cough, shortness of breath.  Patient received the Pfizer vaccine about 1 month ago.    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 Authorizing Provider  acetaminophen (TYLENOL) 325 MG tablet Take 1 tablet (325 mg total) by mouth every 6 (six) hours. 10/23/19   McVey, Prudencio Pair, CNM  hydrocortisone 1 % ointment Apply 1 application topically 2 (two) times daily. 10/23/19   McVey, Prudencio Pair, CNM  ibuprofen (ADVIL) 800 MG tablet Take 1 tablet (800 mg total) by mouth every 6 (six)  hours. 10/23/19   McVey, Prudencio Pair, CNM  levothyroxine (SYNTHROID) 25 MCG tablet Take 1/2 tablet by mouth daily. 09/26/19   Trey Sailors, PA-C  Prenatal Vit-Fe Fumarate-FA (MULTIVITAMIN-PRENATAL) 27-0.8 MG TABS tablet Take 1 tablet by mouth daily at 12 noon.    [provider]  senna-docusate (SENOKOT-S) 8.6-50 MG tablet Take 2 tablets by mouth daily. 10/24/19   McVey, Prudencio Pair, CNM  sertraline (ZOLOFT) 100 MG tablet Take 1 tablet (100 mg total) by mouth daily. 09/26/19   Trey Sailors, PA-C    Allergies Patient has no known allergies.  Family History  Problem Relation Age of Onset   Hypertension Mother    Heart attack Father    Healthy Sister    Healthy Brother    CVA Brother 25   Lymphoma Maternal Grandmother     Social History Social History   Tobacco Use   Smoking status: Never Smoker   Smokeless tobacco: Never Used  Building services engineer Use: Never used  Substance Use Topics   Alcohol use: Yes    Comment: Rarely    Drug use: No     Review of Systems  Constitutional: No fever/chills ENT: Positive for nasal congestion Cardiovascular: No chest pain. Respiratory: No cough. No SOB. Gastrointestinal: No abdominal pain.  No nausea, no vomiting.  Musculoskeletal: Negative for musculoskeletal pain. Skin: Negative for rash, abrasions, lacerations, ecchymosis. Neurological: Negative for headaches, numbness or tingling  ____________________________________________   PHYSICAL EXAM:  VITAL SIGNS: ED Triage Vitals  Enc Vitals Group     BP 11/14/19 0825 107/74     Pulse Rate 11/14/19 0825 94     Resp 11/14/19 0825 16     Temp 11/14/19 0825 98.1 F (36.7 C)     Temp Source 11/14/19 0825 Oral     SpO2 11/14/19 0825 97 %     Weight 11/14/19 0826 123 lb (55.8 kg)     Height 11/14/19 0826 5\' 3"  (1.6 m)     Head Circumference --      Peak Flow --      Pain Score 11/14/19 0825 0     Pain Loc --      Pain Edu? --      Excl. in GC? --       Constitutional: Alert and oriented. Well appearing and in no acute distress. Eyes: Conjunctivae are normal. PERRL. EOMI. Head: Atraumatic. ENT:      Ears:      Nose: Mild congestion/rhinnorhea.      Mouth/Throat: Mucous membranes are moist.  Neck: No stridor.  Cardiovascular: Normal rate, regular rhythm.  Good peripheral circulation. Respiratory: Normal respiratory effort without tachypnea or retractions. Lungs CTAB. Good air entry to the bases with no decreased or absent breath sounds. Musculoskeletal: Full range of motion to all extremities. No gross deformities appreciated. Neurologic:  Normal speech and language. No gross focal neurologic deficits are appreciated.  Skin:  Skin is warm, dry and intact. No rash noted. Psychiatric: Mood and affect are normal. Speech and behavior are normal. Patient exhibits appropriate insight and judgement.   ____________________________________________   LABS (all labs ordered are listed, but only abnormal results are displayed)  Labs Reviewed  SARS CORONAVIRUS 2 BY RT PCR (HOSPITAL ORDER, PERFORMED IN [facility])   ____________________________________________  EKG   ____________________________________________  RADIOLOGY   No results found.  ____________________________________________    PROCEDURES  Procedure(s) performed:    Procedures    Medications - No data to display   ____________________________________________   INITIAL IMPRESSION / ASSESSMENT AND PLAN / ED COURSE  Pertinent labs & imaging results that were available during my care of the patient were reviewed by me and considered in my medical decision making (see chart for details).  Review of the [place] CSRS was performed in accordance of the [facility] prior to dispensing any controlled drugs.     Patient presented to the emergency department for a Covid test.  Vital signs and exam are reassuring.  Covid test sent in process. Patient is to  follow up with primary care as directed. Patient is given ED precautions to return to the ED for any worsening or new symptoms.  ----------------------------------------- 12:34 PM on 11/14/2019 ----------------------------------------- Attempted to call Brianna Brown with her positive Covid test results without answer.  ----------------------------------------- 12:38 PM on 11/14/2019 -----------------------------------------  01/14/2020 returned my phone call and was notified of her positive result.   Brianna Brown was evaluated in Emergency Department on 11/14/2019 for the symptoms described in the history of present illness. She was evaluated in the context of the global COVID-19 pandemic, which necessitated consideration that the patient might be at risk for infection with the SARS-CoV-2 virus that causes COVID-19. Institutional protocols and algorithms that pertain to the evaluation of patients at risk for COVID-19 are in a state of rapid change based on information released by regulatory bodies including the CDC and federal and state organizations. These policies and  algorithms were followed during the patient's care in the ED.   ____________________________________________  FINAL CLINICAL IMPRESSION(S) / ED DIAGNOSES  Final diagnoses:  None      NEW MEDICATIONS STARTED DURING THIS VISIT:  ED Discharge Orders    None          This chart was dictated using voice recognition software/Dragon. Despite best efforts to proofread, errors can occur which can change the meaning. Any change was purely unintentional.    Enid Derry, PA-C 11/14/19 1238    Dionne Bucy, MD 11/14/19 1309

## 2019-11-15 ENCOUNTER — Other Ambulatory Visit (HOSPITAL_COMMUNITY): Payer: Self-pay | Admitting: Adult Health

## 2019-11-15 DIAGNOSIS — U071 COVID-19: Secondary | ICD-10-CM

## 2019-11-15 NOTE — Progress Notes (Signed)
I connected by phone with Brianna Brown on 11/15/2019 at 4:29 PM to discuss the potential use of a new treatment for mild to moderate COVID-19 viral infection in non-hospitalized patients.  This patient is a 44 y.o. female that meets the FDA criteria for Emergency Use Authorization of COVID monoclonal antibody casirivimab/imdevimab.  Has a (+) direct SARS-CoV-2 viral test result  Has mild or moderate COVID-19   Is NOT hospitalized due to COVID-19  Is within 10 days of symptom onset  Has at least one of the high risk factor(s) for progression to severe COVID-19 and/or hospitalization as defined in EUA.  Specific high risk criteria : Chronic Lung Disease   I have spoken and communicated the following to the patient or parent/caregiver regarding COVID monoclonal antibody treatment:  1. FDA has authorized the emergency use for the treatment of mild to moderate COVID-19 in adults and pediatric patients with positive results of direct SARS-CoV-2 viral testing who are 19 years of age and older weighing at least 40 kg, and who are at high risk for progressing to severe COVID-19 and/or hospitalization.  2. The significant known and potential risks and benefits of COVID monoclonal antibody, and the extent to which such potential risks and benefits are unknown.  3. Information on available alternative treatments and the risks and benefits of those alternatives, including clinical trials.  4. Patients treated with COVID monoclonal antibody should continue to self-isolate and use infection control measures (e.g., wear mask, isolate, social distance, avoid sharing personal items, clean and disinfect "high touch" surfaces, and frequent handwashing) according to CDC guidelines.   5. The patient or parent/caregiver has the option to accept or refuse COVID monoclonal antibody treatment.  After reviewing this information with the patient, The patient agreed to proceed with receiving casirivimab\imdevimab  infusion and will be provided a copy of the Fact sheet prior to receiving the infusion. Noreene Filbert 11/15/2019 4:29 PM

## 2019-11-16 ENCOUNTER — Ambulatory Visit (HOSPITAL_COMMUNITY)
Admission: RE | Admit: 2019-11-16 | Discharge: 2019-11-16 | Disposition: A | Discharge status: Home / Self Care | Source: Ambulatory Visit | Attending: Pulmonary Disease | Admitting: Pulmonary Disease

## 2019-11-16 DIAGNOSIS — U071 COVID-19: Secondary | ICD-10-CM | POA: Diagnosis present

## 2019-11-16 MED ORDER — SODIUM CHLORIDE 0.9 % IV SOLN: INTRAVENOUS | Status: DC | PRN

## 2019-11-16 MED ORDER — EPINEPHRINE 0.3 MG/0.3ML IJ SOAJ: 0.3000 mg | Freq: Once | INTRAMUSCULAR | Status: DC | PRN

## 2019-11-16 MED ORDER — ALBUTEROL SULFATE HFA 108 (90 BASE) MCG/ACT IN AERS: 2.0000 | INHALATION_SPRAY | Freq: Once | RESPIRATORY_TRACT | Status: DC | PRN

## 2019-11-16 MED ORDER — SODIUM CHLORIDE 0.9 % IV SOLN
1200.0000 mg | Freq: Once | INTRAVENOUS | Status: AC
  Administered 2019-11-16: 1200 mg via INTRAVENOUS
  Filled 2019-11-16: qty 10

## 2019-11-16 MED ORDER — METHYLPREDNISOLONE SODIUM SUCC 125 MG IJ SOLR: 125.0000 mg | Freq: Once | INTRAMUSCULAR | Status: DC | PRN

## 2019-11-16 MED ORDER — FAMOTIDINE IN NACL 20-0.9 MG/50ML-% IV SOLN: 20.0000 mg | Freq: Once | INTRAVENOUS | Status: DC | PRN

## 2019-11-16 MED ORDER — DIPHENHYDRAMINE HCL 50 MG/ML IJ SOLN: 50.0000 mg | Freq: Once | INTRAMUSCULAR | Status: DC | PRN

## 2019-11-16 NOTE — Discharge Instructions (Signed)

## 2019-11-16 NOTE — Progress Notes (Signed)
  Diagnosis: COVID-19  Physician: Dr. Wright   Procedure: Covid Infusion Clinic Med: casirivimab\imdevimab infusion - Provided patient with casirivimab\imdevimab fact sheet for patients, parents and caregivers prior to infusion.  Complications: No immediate complications noted.  Discharge: Discharged home   Betzaira Mentel L Floye Fesler 11/16/2019   

## 2019-11-20 ENCOUNTER — Ambulatory Visit: Payer: Self-pay

## 2019-11-20 NOTE — Lactation Note (Signed)
This note was copied from a baby's chart. Lactation Consultation Note  Patient Name: Brianna Brown [id number] Date: 11/20/2019   Assisted mom with first breast feed after returning to [facility] after being on quarantine for Covid.  [name] latched with minimal assistance from [facility].  She had a strong, rhythmic suck with great pacing and swallowing.  No apnea or significant drops in SATs were noted.  She did have one episode of increased respirations which resolved quickly.  The next breast feed was not as successful.  About 5 minutes into the 13:45 breast feed, [name] had a bradycardic/desaturation episode with decrease in HR to 72 and oxygen saturation down to 89 without any color change.  Encouraged mom to call lactation with any questions, concerns or assistance.  Maternal Data    Feeding Feeding Type: Breast Milk Nipple Type: Dr. Irving Burton level 4  LATCH Score                   Interventions    Lactation Tools Discussed/Used     Consult Status      Louis Meckel 11/20/2019, 11:08 PM

## 2019-11-21 ENCOUNTER — Ambulatory Visit: Payer: Self-pay

## 2019-11-21 NOTE — Lactation Note (Signed)
This note was copied from a baby's chart. Lactation Consultation Note  Patient Name: Brianna Brown [id number]'[id number] Date: 11/21/2019   Mom came in to visit [name] today, but did not breast feed.  Maternal Data    Feeding Feeding Type: Breast Milk (oatmeal added: 2 tsp/58ml) Nipple Type: Dr. Irving Burton level 4  LATCH Score                   Interventions    Lactation Tools Discussed/Used     Consult Status      Louis Meckel 11/21/2019, 10:56 PM

## 2019-12-22 ENCOUNTER — Other Ambulatory Visit: Payer: Self-pay | Admitting: Physician Assistant

## 2019-12-22 DIAGNOSIS — E039 Hypothyroidism, unspecified: Secondary | ICD-10-CM

## 2019-12-22 NOTE — Telephone Encounter (Signed)
Requested Prescriptions  Pending Prescriptions Disp Refills  . levothyroxine (SYNTHROID) 25 MCG tablet [Pharmacy Med Name: LEVOTHYROXINE 0.025MG  ( ) TAB] 45 tablet 0    Sig: TAKE 1/2 TABLET BY MOUTH DAILY     Endocrinology:  Hypothyroid Agents Failed - 12/22/2019  3:33 AM      Failed - TSH needs to be rechecked within 3 months after an abnormal result. Refill until TSH is due.      Failed - TSH in normal range and within 360 days    TSH  Date Value Ref Range Status  03/12/2017 1.590 0.450 - 4.500 uIU/mL Final         Passed - Valid encounter within last 12 months    Recent Outpatient Visits          2 months ago Hypothyroidism, unspecified type   [facility] [place], [place], PA-C   1 year ago Hypothyroidism, unspecified type   [facility] [place], [place], PA-C   1 year ago Hypothyroidism, unspecified type   [facility] [place], [place], [place]   1 year ago Fever, unspecified fever cause   [facility] [place], [place]   2 years ago Depression, unspecified depression type   [facility], [place], [place]      Future Appointments            In 9 months Trey Sailors, PA-C [facility]

## 2019-12-29 ENCOUNTER — Other Ambulatory Visit: Payer: Self-pay | Admitting: Physician Assistant

## 2019-12-29 DIAGNOSIS — E039 Hypothyroidism, unspecified: Secondary | ICD-10-CM

## 2020-07-09 ENCOUNTER — Other Ambulatory Visit: Payer: Self-pay | Admitting: Physician Assistant

## 2020-07-09 DIAGNOSIS — E039 Hypothyroidism, unspecified: Secondary | ICD-10-CM

## 2020-07-09 DIAGNOSIS — F32A Depression, unspecified: Secondary | ICD-10-CM

## 2020-07-09 MED ORDER — SERTRALINE HCL 100 MG PO TABS: 100.0000 mg | ORAL_TABLET | Freq: Every day | ORAL | 0 refills | Status: DC

## 2020-07-09 MED ORDER — LEVOTHYROXINE SODIUM 25 MCG PO TABS: 12.5000 ug | ORAL_TABLET | Freq: Every day | ORAL | 0 refills | Status: DC

## 2020-07-09 NOTE — Telephone Encounter (Signed)
   Notes to clinic:  Patient has appointment 09/27/2020 Review medication for refill until that appt  Patient has been out for 2 days   Requested Prescriptions  Pending Prescriptions Disp Refills   sertraline (ZOLOFT) 100 MG tablet 90 tablet 3    Sig: Take 1 tablet (100 mg total) by mouth daily.      Psychiatry:  Antidepressants - SSRI Failed - 07/09/2020  1:13 PM      Failed - Valid encounter within last 6 months    Recent Outpatient Visits           9 months ago Hypothyroidism, unspecified type   [facility] [place], [place], PA-C   1 year ago Hypothyroidism, unspecified type   [facility] [place], [place], [place]   1 year ago Hypothyroidism, unspecified type   [facility] [place], [place], [place]   2 years ago Fever, unspecified fever cause   [facility] [place], [place], [place]   3 years ago Depression, unspecified depression type   [facility] [place], [place], PA-C       Future Appointments             In 2 months [place], Adriana M, PA-C [facility]               levothyroxine (SYNTHROID) 25 MCG tablet 45 tablet 0    Sig: Take 0.5 tablets (12.5 mcg total) by mouth daily.      Endocrinology:  Hypothyroid Agents Failed - 07/09/2020  1:13 PM      Failed - TSH needs to be rechecked within 3 months after an abnormal result. Refill until TSH is due.      Failed - TSH in normal range and within 360 days    TSH  Date Value Ref Range Status  03/12/2017 1.590 0.450 - 4.500 uIU/mL Final          Passed - Valid encounter within last 12 months    Recent Outpatient Visits           9 months ago Hypothyroidism, unspecified type   [facility] [place], [place], PA-C   1 year ago Hypothyroidism, unspecified type   [facility] [place], [place], PA-C   1 year ago Hypothyroidism, unspecified type   [facility] [place], [place], [place]    2 years ago Fever, unspecified fever cause   [facility] [place], [place], [place]   3 years ago Depression, unspecified depression type   [facility] [place], [place], [place]       Future Appointments             In 2 months Trey Sailors, PA-C [facility]

## 2020-07-09 NOTE — Telephone Encounter (Signed)
Pt spouse called back stating that the pt is out of her medication for 2 days.

## 2020-07-09 NOTE — Telephone Encounter (Signed)
Medication Refill - Medication:   sertraline (ZOLOFT) 100 MG tablet levothyroxine (SYNTHROID) 25 MCG tablet     Preferred Pharmacy (with phone number or street name):  [id number] [place], [place] - [address] AT [facility] [address] Phone:  [phone]  Fax:  [phone]       Agent: Please be advised that RX refills may take up to 3 business days. We ask that you follow-up with your pharmacy.

## 2020-07-10 ENCOUNTER — Telehealth: Payer: Self-pay | Admitting: Family Medicine

## 2020-07-10 NOTE — Telephone Encounter (Signed)
I spoke with patient's husband.   Gave appt for 07/16/20 at 2:00 with Dr. Sullivan Lone.    A RX for Sertraline 100 mg.  was sent in for #90 pills today.  Patient  husb aware that the insurance will not cover the meds but he said he will pay out of pocket for this since his wife has increased the dose to 2 pills a day.

## 2020-07-10 NOTE — Telephone Encounter (Signed)
We will just need to see when someone has an available appt.  This is former [place] patient that can see any provider. CMAs can give curtesy refill of Sertraline to get her to appt  Copied from CRM [id number]. Topic: General - Other >> Jul 10, 2020  9:06 AM Gwenlyn Fudge wrote: Reason for CRM: Pts husband called to get an appt for pt. He states that the pt has doubled up on the current depression medication, sertraline, and that pt is completely out. He is requesting to have pot scheduled with an appt to increase her dose. Please advise.

## 2020-07-16 ENCOUNTER — Ambulatory Visit: Admitting: Family Medicine

## 2020-07-16 NOTE — Progress Notes (Deleted)
      Established patient visit   Patient: Brianna Brown   DOB: 27-Mar-1975   45 y.o. Female  MRN: [id number] Visit Date: 07/16/2020  Today's healthcare provider: Megan Mans, MD   No chief complaint on file.  Subjective    HPI  Depression, Follow-up  She  was last seen for this 10 months ago. Changes made at last visit include; on sertraline 100 mg.   She reports {excellent/good/fair/poor:19665} compliance with treatment. She {is/is not:21021397} having side effects. ***  She reports {DESC; GOOD/FAIR/POOR:18685} tolerance of treatment. Current symptoms include: {Symptoms; depression:1002} She feels she is {improved/worse/unchanged:3041574} since last visit.  Depression screen [facility] 2/9 12/14/2018 09/04/2018 08/17/2018  Decreased Interest 0 1 1  Down, Depressed, Hopeless 0 1 1  PHQ - 2 Score 0 2 2  Altered sleeping - 1 1  Tired, decreased energy - 1 1  Change in appetite - 1 1  Feeling bad or failure about yourself  - 1 1  Trouble concentrating - 0 0  Moving slowly or fidgety/restless - 0 0  Suicidal thoughts - 0 0  PHQ-9 Score - 6 6  Difficult doing work/chores - Somewhat difficult Somewhat difficult    -----------------------------------------------------------------------------------------   {Show patient history (optional):23778::" "}   Medications: Outpatient Medications Prior to Visit  Medication Sig  . acetaminophen (TYLENOL) 325 MG tablet Take 1 tablet (325 mg total) by mouth every 6 (six) hours.  . hydrocortisone 1 % ointment Apply 1 application topically 2 (two) times daily.  Marland Kitchen ibuprofen (ADVIL) 800 MG tablet Take 1 tablet (800 mg total) by mouth every 6 (six) hours.  Marland Kitchen levothyroxine (SYNTHROID) 25 MCG tablet Take 0.5 tablets (12.5 mcg total) by mouth daily. Please tell patient she needs appointment to have more  . Prenatal Vit-Fe Fumarate-FA (MULTIVITAMIN-PRENATAL) 27-0.8 MG TABS tablet Take 1 tablet by mouth daily at 12 noon.  . senna-docusate  (SENOKOT-S) 8.6-50 MG tablet Take 2 tablets by mouth daily.  . sertraline (ZOLOFT) 100 MG tablet Take 1 tablet (100 mg total) by mouth daily.   No facility-administered medications prior to visit.    Review of Systems  Constitutional: Negative for appetite change, chills, fatigue and fever.  Respiratory: Negative for chest tightness and shortness of breath.   Cardiovascular: Negative for chest pain and palpitations.  Gastrointestinal: Negative for abdominal pain, nausea and vomiting.  Neurological: Negative for dizziness and weakness.    {Labs  Heme  Chem  Endocrine  Serology  Results Review (optional):23779::" "}   Objective    There were no vitals taken for this visit. {Show previous vital signs (optional):23777::" "}   Physical Exam  ***  No results found for any visits on 07/16/20.  Assessment & Plan     ***  No follow-ups on file.      {provider attestation***:1}   Megan Mans, MD  [facility] [phone] (phone) [phone] (fax)  [facility]

## 2020-07-25 ENCOUNTER — Other Ambulatory Visit: Payer: Self-pay | Admitting: Family Medicine

## 2020-07-25 ENCOUNTER — Ambulatory Visit: Admitting: Family Medicine

## 2020-07-25 DIAGNOSIS — E039 Hypothyroidism, unspecified: Secondary | ICD-10-CM

## 2020-09-12 ENCOUNTER — Telehealth: Payer: Self-pay | Admitting: Physician Assistant

## 2020-09-12 DIAGNOSIS — F32A Depression, unspecified: Secondary | ICD-10-CM

## 2020-09-12 MED ORDER — SERTRALINE HCL 100 MG PO TABS: 100.0000 mg | ORAL_TABLET | Freq: Every day | ORAL | 0 refills | Status: DC

## 2020-09-12 NOTE — Telephone Encounter (Signed)
[facility] faxed refill request for the following medications:  sertraline (ZOLOFT) 100 MG tablet   Please advise.  

## 2020-09-27 ENCOUNTER — Ambulatory Visit: Payer: Self-pay | Admitting: Family Medicine

## 2020-09-27 ENCOUNTER — Ambulatory Visit: Payer: Self-pay | Admitting: Physician Assistant

## 2020-10-05 ENCOUNTER — Ambulatory Visit (INDEPENDENT_AMBULATORY_CARE_PROVIDER_SITE_OTHER): Payer: 59 | Admitting: Family Medicine

## 2020-10-05 ENCOUNTER — Encounter: Payer: Self-pay | Admitting: Family Medicine

## 2020-10-05 ENCOUNTER — Other Ambulatory Visit: Payer: Self-pay

## 2020-10-05 VITALS — BP 103/69 | HR 81 | Temp 98.4°F | Ht 63.0 in | Wt 119.2 lb

## 2020-10-05 DIAGNOSIS — F32A Depression, unspecified: Secondary | ICD-10-CM | POA: Diagnosis not present

## 2020-10-05 DIAGNOSIS — E039 Hypothyroidism, unspecified: Secondary | ICD-10-CM | POA: Diagnosis not present

## 2020-10-05 MED ORDER — SERTRALINE HCL 100 MG PO TABS: 200.0000 mg | ORAL_TABLET | Freq: Every day | ORAL | 3 refills | Status: DC

## 2020-10-05 NOTE — Progress Notes (Signed)
Established patient visit   Patient: Brianna Brown   DOB: 06/03/1975   45 y.o. Female  MRN: [id number] Visit Date: 10/05/2020  Today's healthcare provider: Dortha Kern, PA-C   No chief complaint on file.  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   Hypothyroidism 02/26/2017   History of anorexia nervosa 02/26/2017   Past Medical History:  Diagnosis Date   Asthma    Exercise-Induced   Depression    Hypothyroidism    Past Surgical History:  Procedure Laterality Date   CESAREAN SECTION     x's 3   CESAREAN SECTION  10/20/2019   Procedure: Repeat CESAREAN SECTION;  Surgeon: Christeen Douglas, MD;  Location: [facility] ORS;  Service: Obstetrics;;   Family History  Problem Relation Age of Onset   Hypertension Mother    Heart attack Father    Healthy Sister    Healthy Brother    CVA Brother 18   Lymphoma Maternal Grandmother    No Known Allergies   Medications: Outpatient Medications Prior to Visit  Medication Sig   acetaminophen (TYLENOL) 325 MG tablet Take 1 tablet (325 mg total) by mouth every 6 (six) hours.   hydrocortisone 1 % ointment Apply 1 application topically 2 (two) times daily.   ibuprofen (ADVIL) 800 MG tablet Take 1 tablet (800 mg total) by mouth every 6 (six) hours.   levothyroxine (SYNTHROID) 25 MCG tablet Take 0.5 tablets (12.5 mcg total) by mouth daily. Please tell patient she needs appointment to have more   Prenatal Vit-Fe Fumarate-FA (MULTIVITAMIN-PRENATAL) 27-0.8 MG TABS tablet Take 1 tablet by mouth daily at 12 noon.   senna-docusate (SENOKOT-S) 8.6-50 MG tablet Take 2 tablets by mouth daily.   sertraline (ZOLOFT) 100 MG tablet  Take 1 tablet (100 mg total) by mouth daily.   No facility-administered medications prior to visit.    Review of Systems  Constitutional:  Positive for unexpected weight change.  HENT: Negative.    Respiratory: Negative.    Cardiovascular: Negative.   Gastrointestinal: Negative.   Musculoskeletal: Negative.   Skin: Negative.   Psychiatric/Behavioral:  Positive for dysphoric mood. Negative for suicidal ideas.    Last CBC Lab Results  Component Value Date   WBC 23.3 (H) 10/21/2019   HGB 11.1 (L) 10/21/2019   HCT 32.3 (L) 10/21/2019   MCV 84.8 10/21/2019   MCH 29.1 10/21/2019   RDW 12.3 10/21/2019   PLT 220 10/21/2019   Last metabolic panel Lab Results  Component Value Date   GLUCOSE 194 (H) 06/29/2019   NA 136 06/29/2019   K 3.3 (L) 06/29/2019   CL 106 06/29/2019   CO2 24 06/29/2019   BUN 6 06/29/2019   CREATININE 0.64 06/29/2019   GFRNONAA >60 06/29/2019   GFRAA >60 06/29/2019   CALCIUM 8.5 (L) 06/29/2019   PROT 6.1 (L) 06/29/2019   ALBUMIN 3.3 (L) 06/29/2019   LABGLOB 2.4 03/12/2017   AGRATIO 1.7 03/12/2017   BILITOT 0.4 06/29/2019   ALKPHOS 46 06/29/2019   AST 15 06/29/2019   ALT 11 06/29/2019   ANIONGAP 6 06/29/2019   Last lipids Lab Results  Component Value Date   CHOL 195 03/12/2017  HDL 68 03/12/2017   LDLCALC 105 (H) 03/12/2017   TRIG 108 03/12/2017   CHOLHDL 2.9 03/12/2017   Last hemoglobin A1c No results found for: HGBA1C Last thyroid functions Lab Results  Component Value Date   TSH 1.590 03/12/2017   Last vitamin D No results found for: 25OHVITD2, 25OHVITD3, VD25OH Last vitamin B12 and Folate No results found for: VITAMINB12, FOLATE     Objective    BP 103/69   Pulse 81   Temp 98.4 F (36.9 C) (Oral)   Ht 5\' 3"  (1.6 m)   Wt 119 lb 3.2 oz (54.1 kg)   SpO2 100%   BMI 21.12 kg/m  BP Readings from Last 3 Encounters:  11/16/19 102/79  11/14/19 107/74  10/23/19 112/89   Wt Readings from Last 3 Encounters:  11/14/19 123 lb  (55.8 kg)  10/20/19 143 lb (64.9 kg)  10/18/19 143 lb (64.9 kg)   Physical Exam Constitutional:      General: She is not in acute distress.    Appearance: She is well-developed.  HENT:     Head: Normocephalic and atraumatic.     Right Ear: Hearing normal.     Left Ear: Hearing normal.     Nose: Nose normal.  Eyes:     General: Lids are normal. No scleral icterus.       Right eye: No discharge.        Left eye: No discharge.     Conjunctiva/sclera: Conjunctivae normal.  Cardiovascular:     Rate and Rhythm: Normal rate and regular rhythm.     Heart sounds: Normal heart sounds.  Pulmonary:     Effort: Pulmonary effort is normal. No respiratory distress.     Breath sounds: Normal breath sounds.  Abdominal:     General: Bowel sounds are normal.     Palpations: Abdomen is soft.  Musculoskeletal:        General: Normal range of motion.     Cervical back: Neck supple.  Skin:    Findings: No lesion or rash.  Neurological:     Mental Status: She is alert and oriented to person, place, and time.  Psychiatric:        Speech: Speech normal.        Behavior: Behavior normal.        Thought Content: Thought content normal.      No results found for any visits on 10/05/20.  Assessment & Plan     1. Depression, unspecified depression type Stressful situations causing sadness and near tears during office visit today. Has tried increasing the Zoloft 100 mg to 1.5 tablets daily and feels she may need a little more. May increase to 200 mg for a while, then decrease back to 100-150 in 3 weeks. Recheck labs. Recheck pending reports and may need to consider counseling and/or evaluation by psychiatrist for further medication adjustments. - sertraline (ZOLOFT) 100 MG tablet; Take 2 tablets (200 mg total) by mouth daily.  Dispense: 60 tablet; Refill: 3 - CBC with Differential/Platelet - Comprehensive metabolic panel - TSH  2. Hypothyroidism, unspecified type Still taking Levothyroxine 25  mcg 1/2 tablet daily. No goiter, hyperreflexia, tremor,hair loss or brittle nails. Will recheck thyroid function tests. - TSH - T4   No follow-ups on file.      I, Robyne Matar, PA-C, have reviewed all documentation for this visit. The documentation on 10/05/20 for the exam, diagnosis, procedures, and orders are all accurate and complete.    10/07/20, PA-C  [place] [phone] (phone) [phone] (fax)  [place]

## 2020-10-11 ENCOUNTER — Other Ambulatory Visit: Payer: Self-pay | Admitting: Family Medicine

## 2020-10-11 DIAGNOSIS — F32A Depression, unspecified: Secondary | ICD-10-CM

## 2020-10-11 NOTE — Telephone Encounter (Signed)
Medication has already been filled

## 2020-10-22 ENCOUNTER — Telehealth: Payer: Self-pay | Admitting: Family Medicine

## 2020-10-22 DIAGNOSIS — E039 Hypothyroidism, unspecified: Secondary | ICD-10-CM

## 2020-10-22 NOTE — Telephone Encounter (Signed)
Just FYI:  We do not have lab appointments in our office.  It is first come first serve.  You scheduled a lab appointment on the providers schedule for 10/29/2020, not today.  I have canceled that appointment due to no such thing.  The pt came to do labs this morning.  If she needs a office visit with the provider please call her to schedule.

## 2020-10-22 NOTE — Telephone Encounter (Signed)
Labs due for medication refills. TC to patient. Scheduled lab visit for this morning. LOV 10/05/20.

## 2020-10-23 LAB — COMPREHENSIVE METABOLIC PANEL
ALT: 13 IU/L (ref 0–32)
AST: 17 IU/L (ref 0–40)
Albumin/Globulin Ratio: 2.5 — ABNORMAL HIGH (ref 1.2–2.2)
Albumin: 4.7 g/dL (ref 3.8–4.8)
Alkaline Phosphatase: 50 IU/L (ref 44–121)
BUN/Creatinine Ratio: 21 (ref 9–23)
BUN: 12 mg/dL (ref 6–24)
Bilirubin Total: 0.3 mg/dL (ref 0.0–1.2)
CO2: 22 mmol/L (ref 20–29)
Calcium: 9.2 mg/dL (ref 8.7–10.2)
Chloride: 103 mmol/L (ref 96–106)
Creatinine, Ser: 0.57 mg/dL (ref 0.57–1.00)
Globulin, Total: 1.9 g/dL (ref 1.5–4.5)
Glucose: 95 mg/dL (ref 65–99)
Potassium: 4.7 mmol/L (ref 3.5–5.2)
Sodium: 140 mmol/L (ref 134–144)
Total Protein: 6.6 g/dL (ref 6.0–8.5)
eGFR: 114 mL/min/{1.73_m2} (ref >59)

## 2020-10-23 LAB — CBC WITH DIFFERENTIAL/PLATELET
Basophils Absolute: 0.1 10*3/uL (ref 0.0–0.2)
Basos: 2 %
EOS (ABSOLUTE): 0.2 10*3/uL (ref 0.0–0.4)
Eos: 4 %
Hematocrit: 40.6 % (ref 34.0–46.6)
Hemoglobin: 13.2 g/dL (ref 11.1–15.9)
Immature Grans (Abs): 0 10*3/uL (ref 0.0–0.1)
Immature Granulocytes: 0 %
Lymphocytes Absolute: 1.3 10*3/uL (ref 0.7–3.1)
Lymphs: 29 %
MCH: 29.7 pg (ref 26.6–33.0)
MCHC: 32.5 g/dL (ref 31.5–35.7)
MCV: 91 fL (ref 79–97)
Monocytes Absolute: 0.4 10*3/uL (ref 0.1–0.9)
Monocytes: 8 %
Neutrophils Absolute: 2.6 10*3/uL (ref 1.4–7.0)
Neutrophils: 57 %
Platelets: 279 10*3/uL (ref 150–450)
RBC: 4.44 x10E6/uL (ref 3.77–5.28)
RDW: 13.4 % (ref 11.7–15.4)
WBC: 4.6 10*3/uL (ref 3.4–10.8)

## 2020-10-23 LAB — T4: T4, Total: 4.5 ug/dL (ref 4.5–12.0)

## 2020-10-23 LAB — TSH: TSH: 1.02 u[IU]/mL (ref 0.450–4.500)

## 2020-10-23 NOTE — Progress Notes (Signed)
Hello,    Your lab results have returned.  Your cell counts have improved over the past year; you are now longer in the anemia risk category.  All other results normal and stable.  Please let us know if you have any questions.  Thank you,  Merita Norton, FNP

## 2020-10-29 ENCOUNTER — Other Ambulatory Visit: Payer: 59 | Admitting: Family Medicine

## 2021-01-11 ENCOUNTER — Ambulatory Visit: Payer: Self-pay | Admitting: *Deleted

## 2021-01-11 NOTE — Telephone Encounter (Signed)
Reason for Disposition  [1] Probable influenza (fever) with no complications AND [2] NOT HIGH RISK  [1] Patient is NOT HIGH RISK AND [2] strongly requests antiviral medicine AND [3] flu symptoms present < 48 hours  Answer Assessment - Initial Assessment Questions 1. WORST SYMPTOM: "What is your worst symptom?" (e.g., cough, runny nose, muscle aches, headache, sore throat, fever)      Body aches-fever 2. ONSET: "When did your flu symptoms start?"      yesterday 3. COUGH: "How bad is the cough?"       Productive- green mucus 4. RESPIRATORY DISTRESS: "Describe your breathing."      Nose is congested- breathing is normal 5. FEVER: "Do you have a fever?" If Yes, ask: "What is your temperature, how was it measured, and when did it start?"     101- this morning- oral 6. EXPOSURE: "Were you exposed to someone with influenza?"       Possible children 7. FLU VACCINE: "Did you get a flu shot this year?"     no 8. HIGH RISK DISEASE: "Do you have any chronic medical problems?" (e.g., heart or lung disease, asthma, weak immune system, or other HIGH RISK conditions)     Asthma history 9. PREGNANCY: "Is there any chance you are pregnant?" "When was your last menstrual period?"     No-LMP- yesterday 10. OTHER SYMPTOMS: "Do you have any other symptoms?"  (e.g., runny nose, muscle aches, headache, sore throat)       Congestion, muscle ache, headache  Protocols used: Influenza - [facility]

## 2021-01-11 NOTE — Telephone Encounter (Signed)
Patient is calling to report she has flu like symptoms that started yesterday- patient states she has not tested for COVID- but she has had it in the past- and feels this is different. Advised patient to do COVID testing- rule that out- call to office- no appointment available- advised UC for symptoms and treatment evaluation.

## 2021-01-16 ENCOUNTER — Other Ambulatory Visit: Payer: Self-pay | Admitting: Family Medicine

## 2021-01-16 DIAGNOSIS — E039 Hypothyroidism, unspecified: Secondary | ICD-10-CM

## 2021-02-04 ENCOUNTER — Telehealth: Payer: Self-pay | Admitting: Family Medicine

## 2021-02-04 DIAGNOSIS — F32A Depression, unspecified: Secondary | ICD-10-CM

## 2021-02-04 MED ORDER — SERTRALINE HCL 100 MG PO TABS
200.0000 mg | ORAL_TABLET | Freq: Every day | ORAL | 1 refills | Status: DC
Start: 2021-02-04 — End: 2021-04-18

## 2021-02-04 NOTE — Telephone Encounter (Signed)
[facility] faxed refill request for the following medications: ° °sertraline (ZOLOFT) 100 MG tablet ° ° ° °Please advise. °

## 2021-02-04 NOTE — Addendum Note (Signed)
Addended by: Kavin Leech E on: 02/04/2021 10:34 AM   Modules accepted: Orders

## 2021-04-05 ENCOUNTER — Ambulatory Visit: Payer: Self-pay | Admitting: Family Medicine

## 2021-04-11 ENCOUNTER — Ambulatory Visit: Payer: Self-pay | Admitting: Family Medicine

## 2021-04-17 NOTE — Progress Notes (Signed)
Established patient visit   Patient: Brianna Brown   DOB: [birth year]   46 y.o. Female  MRN: [id number] Visit Date: 04/18/2021  Today's healthcare provider: Jacky Kindle, FNP   No chief complaint on file.  Subjective    HPI  Depression, Follow-up  She  was last seen for this 6 months ago. Changes made at last visit include none.   She reports good compliance with treatment. She is not having side effects.   She reports good tolerance of treatment. Current symptoms include: anhedonia, depressed mood, fatigue, feelings of worthlessness/guilt, hopelessness, hypersomnia, and patient does struggle with anorexia. She feels she is Unchanged since last visit.  Patient has found a new counselor and she recommended that she may need labs and possible medication adjustment.  Depression screen [facility] 2/9 04/18/2021 10/05/2020 12/14/2018  Decreased Interest 3 - 0  Down, Depressed, Hopeless 3 3 0  PHQ - 2 Score 6 3 0  Altered sleeping 3 2 -  Tired, decreased energy 3 3 -  Change in appetite 3 2 -  Feeling bad or failure about yourself  3 1 -  Trouble concentrating 2 0 -  Moving slowly or fidgety/restless 2 0 -  Suicidal thoughts 1 0 -  PHQ-9 Score 23 11 -  Difficult doing work/chores Very difficult Somewhat difficult -    -----------------------------------------------------------------------------------------   Medications: Outpatient Medications Prior to Visit  Medication Sig   levothyroxine (SYNTHROID) 25 MCG tablet TAKE 1/2 TABLET BY MOUTH DAILY   [DISCONTINUED] acetaminophen (TYLENOL) 325 MG tablet Take 1 tablet (325 mg total) by mouth every 6 (six) hours.   [DISCONTINUED] hydrocortisone 1 % ointment Apply 1 application topically 2 (two) times daily.   [DISCONTINUED] ibuprofen (ADVIL) 800 MG tablet Take 1 tablet (800 mg total) by mouth every 6 (six) hours.   [DISCONTINUED] Prenatal Vit-Fe Fumarate-FA (MULTIVITAMIN-PRENATAL) 27-0.8 MG TABS tablet Take 1 tablet by mouth  daily at 12 noon. (Patient not taking: Reported on 10/05/2020)   [DISCONTINUED] senna-docusate (SENOKOT-S) 8.6-50 MG tablet Take 2 tablets by mouth daily. (Patient not taking: Reported on 10/05/2020)   [DISCONTINUED] sertraline (ZOLOFT) 100 MG tablet Take 2 tablets (200 mg total) by mouth daily.   No facility-administered medications prior to visit.    Review of Systems     Objective    BP 100/70 (BP Location: Right Arm, Patient Position: Sitting, Cuff Size: Normal)    Pulse 74    Temp 98.8 F (37.1 C) (Oral)    Wt 116 lb (52.6 kg)    SpO2 99%    BMI 20.55 kg/m    Physical Exam Vitals and nursing note reviewed.  Constitutional:      General: She is not in acute distress.    Appearance: Normal appearance. She is normal weight. She is not ill-appearing, toxic-appearing or diaphoretic.  HENT:     Head: Normocephalic and atraumatic.  Cardiovascular:     Rate and Rhythm: Normal rate and regular rhythm.     Pulses: Normal pulses.     Heart sounds: Normal heart sounds. No murmur heard.   No friction rub. No gallop.  Pulmonary:     Effort: Pulmonary effort is normal. No respiratory distress.     Breath sounds: Normal breath sounds. No stridor. No wheezing, rhonchi or rales.  Chest:     Chest wall: No tenderness.  Abdominal:     General: Bowel sounds are normal.     Palpations: Abdomen is soft.  Musculoskeletal:  General: No swelling, tenderness, deformity or signs of injury. Normal range of motion.     Right lower leg: No edema.     Left lower leg: No edema.  Skin:    General: Skin is warm and dry.     Capillary Refill: Capillary refill takes less than 2 seconds.     Coloration: Skin is not jaundiced or pale.     Findings: No bruising, erythema, lesion or rash.  Neurological:     General: No focal deficit present.     Mental Status: She is alert and oriented to person, place, and time. Mental status is at baseline.     Cranial Nerves: No cranial nerve deficit.      Sensory: No sensory deficit.     Motor: No weakness.     Coordination: Coordination normal.  Psychiatric:        Mood and Affect: Mood normal. Affect is tearful.        Behavior: Behavior normal.        Thought Content: Thought content normal.        Judgment: Judgment normal.     No results found for any visits on 04/18/21.  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- voiced lowest weight of 88 lbs Recommend connection of NEDA to assist      Relevant Orders   Comprehensive metabolic panel   Depression, recurrent (HCC) - Primary    Chronic, worsening Doing well on medication Denies SI or HI Doing well with counseling with better help Denies need for additional resources at this time Long standing hx- since youth Positive family hx- dad, 'clinical depression' Driving apart her marriage Encourage couples counseling to assist Continue to take SSRI, work on [employer], additional lab work today given previously lost to f/u      Relevant Medications   sertraline (ZOLOFT) 100 MG tablet   Need for influenza vaccination    provided      Relevant Orders   Flu Vaccine QUAD 6+ mos PF IM (Fluarix Quad PF) (Completed)   History of anemia    Likely related to eating disorder      Relevant Orders   CBC with Differential/Platelet   Elevated LDL cholesterol level    Repeat lipid Non fasting      Relevant Orders   Lipid panel   Encounter for hepatitis C screening test for low risk patient    Low risk screen      Relevant Orders   Hepatitis C Antibody   Colon cancer screening    46 year old screen Low risk, OK for colo guard Discussed less effective, less specific, more frequent testing  pattern Plan to complete given complex work and home scheduled       Relevant Orders   Cologuard   Nonsuicidal self-injury (HCC)    Reports compulsive scratching behaviors, in shower, in order to experience feeling in her numb state      Other Visit Diagnoses     Depression, unspecified depression type       Relevant Medications   sertraline (ZOLOFT) 100 MG tablet  Return in about 6 months (around 10/16/2021) for annual examination.      Vivien Rota DeSanto,acting as a scribe for Jacky Kindle, FNP.,have documented all relevant documentation on the behalf of Jacky Kindle, FNP,as directed by  Jacky Kindle, FNP while in the presence of Jacky Kindle, FNP.  Leilani Merl, FNP, have reviewed all documentation for this visit. The documentation on 04/18/21 for the exam, diagnosis, procedures, and orders are all accurate and complete.    Jacky Kindle, FNP  [facility] [phone] (phone) [phone] (fax)  [facility]

## 2021-04-18 ENCOUNTER — Encounter: Payer: Self-pay | Admitting: Family Medicine

## 2021-04-18 ENCOUNTER — Other Ambulatory Visit: Payer: Self-pay

## 2021-04-18 ENCOUNTER — Ambulatory Visit (INDEPENDENT_AMBULATORY_CARE_PROVIDER_SITE_OTHER): Payer: Self-pay | Admitting: Family Medicine

## 2021-04-18 VITALS — BP 100/70 | HR 74 | Temp 98.8°F | Wt 116.0 lb

## 2021-04-18 DIAGNOSIS — Z1211 Encounter for screening for malignant neoplasm of colon: Secondary | ICD-10-CM | POA: Insufficient documentation

## 2021-04-18 DIAGNOSIS — Z862 Personal history of diseases of the blood and blood-forming organs and certain disorders involving the immune mechanism: Secondary | ICD-10-CM | POA: Insufficient documentation

## 2021-04-18 DIAGNOSIS — F339 Major depressive disorder, recurrent, unspecified: Secondary | ICD-10-CM

## 2021-04-18 DIAGNOSIS — Z8659 Personal history of other mental and behavioral disorders: Secondary | ICD-10-CM

## 2021-04-18 DIAGNOSIS — F32A Depression, unspecified: Secondary | ICD-10-CM

## 2021-04-18 DIAGNOSIS — Z1159 Encounter for screening for other viral diseases: Secondary | ICD-10-CM | POA: Insufficient documentation

## 2021-04-18 DIAGNOSIS — E7800 Pure hypercholesterolemia, unspecified: Secondary | ICD-10-CM | POA: Insufficient documentation

## 2021-04-18 DIAGNOSIS — R4588 Nonsuicidal self-harm: Secondary | ICD-10-CM | POA: Insufficient documentation

## 2021-04-18 DIAGNOSIS — Z23 Encounter for immunization: Secondary | ICD-10-CM | POA: Insufficient documentation

## 2021-04-18 DIAGNOSIS — E039 Hypothyroidism, unspecified: Secondary | ICD-10-CM

## 2021-04-18 MED ORDER — SERTRALINE HCL 100 MG PO TABS: 200.0000 mg | ORAL_TABLET | Freq: Every day | ORAL | 1 refills | Status: AC

## 2021-04-18 NOTE — Assessment & Plan Note (Signed)
Chronic, worsening Doing well on medication Denies SI or HI Doing well with counseling with better help Denies need for additional resources at this time Long standing hx- since youth Positive family hx- dad, 'clinical depression' Driving apart her marriage Encourage couples counseling to assist Continue to take SSRI, work on [employer], additional lab work today given previously lost to f/u

## 2021-04-18 NOTE — Assessment & Plan Note (Signed)
Known hx, repeat labs Weight stable No hot/cold flashes No hair loss

## 2021-04-18 NOTE — Assessment & Plan Note (Signed)
Likely related to eating disorder

## 2021-04-18 NOTE — Assessment & Plan Note (Signed)
Low risk screen °

## 2021-04-18 NOTE — Assessment & Plan Note (Signed)
Reports compulsive scratching behaviors, in shower, in order to experience feeling in her numb state

## 2021-04-18 NOTE — Assessment & Plan Note (Signed)
provided ?

## 2021-04-18 NOTE — Assessment & Plan Note (Signed)
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

## 2021-04-18 NOTE — Assessment & Plan Note (Signed)
46 year old screen Low risk, OK for colo guard Discussed less effective, less specific, more frequent testing pattern Plan to complete given complex work and home scheduled

## 2021-04-18 NOTE — Assessment & Plan Note (Signed)
Repeat lipid Non fasting

## 2021-04-19 LAB — COMPREHENSIVE METABOLIC PANEL
ALT: 12 IU/L (ref 0–32)
AST: 17 IU/L (ref 0–40)
Albumin/Globulin Ratio: 2.3 — ABNORMAL HIGH (ref 1.2–2.2)
Albumin: 4.6 g/dL (ref 3.8–4.8)
Alkaline Phosphatase: 60 IU/L (ref 44–121)
BUN/Creatinine Ratio: 14 (ref 9–23)
BUN: 10 mg/dL (ref 6–24)
Bilirubin Total: 0.3 mg/dL (ref 0.0–1.2)
CO2: 26 mmol/L (ref 20–29)
Calcium: 9.4 mg/dL (ref 8.7–10.2)
Chloride: 101 mmol/L (ref 96–106)
Creatinine, Ser: 0.74 mg/dL (ref 0.57–1.00)
Globulin, Total: 2 g/dL (ref 1.5–4.5)
Glucose: 81 mg/dL (ref 70–99)
Potassium: 5.1 mmol/L (ref 3.5–5.2)
Sodium: 140 mmol/L (ref 134–144)
Total Protein: 6.6 g/dL (ref 6.0–8.5)
eGFR: 102 mL/min/{1.73_m2} (ref >59)

## 2021-04-19 LAB — CBC WITH DIFFERENTIAL/PLATELET
Basophils Absolute: 0.1 10*3/uL (ref 0.0–0.2)
Basos: 1 %
EOS (ABSOLUTE): 0.1 10*3/uL (ref 0.0–0.4)
Eos: 2 %
Hematocrit: 40.6 % (ref 34.0–46.6)
Hemoglobin: 13.7 g/dL (ref 11.1–15.9)
Immature Grans (Abs): 0 10*3/uL (ref 0.0–0.1)
Immature Granulocytes: 0 %
Lymphocytes Absolute: 1.5 10*3/uL (ref 0.7–3.1)
Lymphs: 23 %
MCH: 30.4 pg (ref 26.6–33.0)
MCHC: 33.7 g/dL (ref 31.5–35.7)
MCV: 90 fL (ref 79–97)
Monocytes Absolute: 0.5 10*3/uL (ref 0.1–0.9)
Monocytes: 7 %
Neutrophils Absolute: 4.5 10*3/uL (ref 1.4–7.0)
Neutrophils: 67 %
Platelets: 290 10*3/uL (ref 150–450)
RBC: 4.51 x10E6/uL (ref 3.77–5.28)
RDW: 12.6 % (ref 11.7–15.4)
WBC: 6.6 10*3/uL (ref 3.4–10.8)

## 2021-04-19 LAB — LIPID PANEL
Chol/HDL Ratio: 2.7 ratio (ref 0.0–4.4)
Cholesterol, Total: 170 mg/dL (ref 100–199)
HDL: 62 mg/dL (ref >39)
LDL Chol Calc (NIH): 91 mg/dL (ref 0–99)
Triglycerides: 93 mg/dL (ref 0–149)
VLDL Cholesterol Cal: 17 mg/dL (ref 5–40)

## 2021-04-19 LAB — TSH+FREE T4
Free T4: 1.09 ng/dL (ref 0.82–1.77)
TSH: 0.894 u[IU]/mL (ref 0.450–4.500)

## 2021-04-19 LAB — HEPATITIS C ANTIBODY: Hep C Virus Ab: <0.1 s/co ratio (ref 0.0–0.9)

## 2021-05-15 IMAGING — US US OB LIMITED
1 series · 14 of 26 positions shown · non-contrast
Comparison: none

CLINICAL DATA: 44-year-old pregnant female with fall. Decreased
fetal movement.

EXAM:
LIMITED OBSTETRIC ULTRASOUND

[Series 1: ob us · 26 acquisitions, 14 frames shown]
[im 1/26]
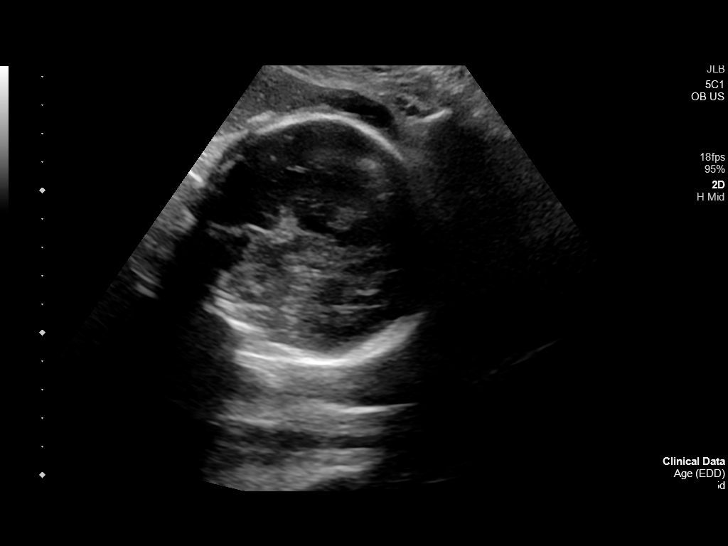
[im 3/26]
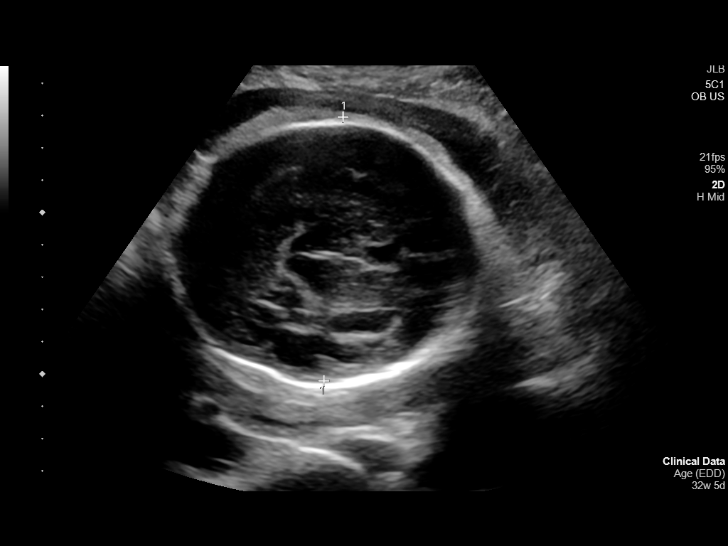
[im 5/26]
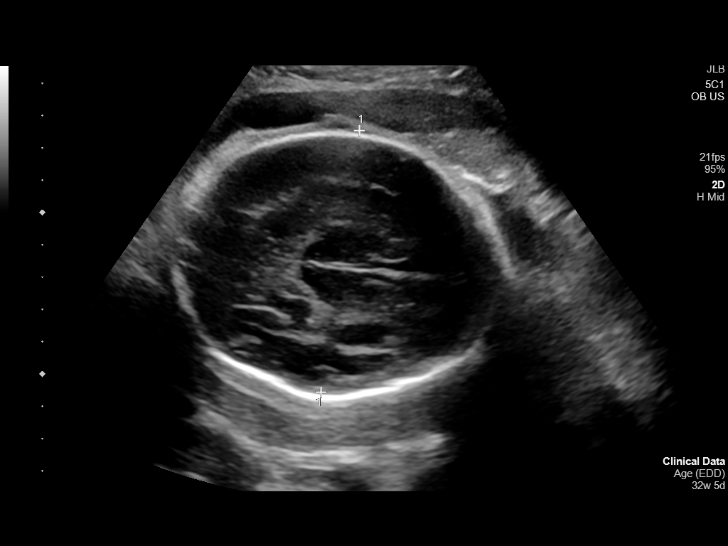
[im 7/26]
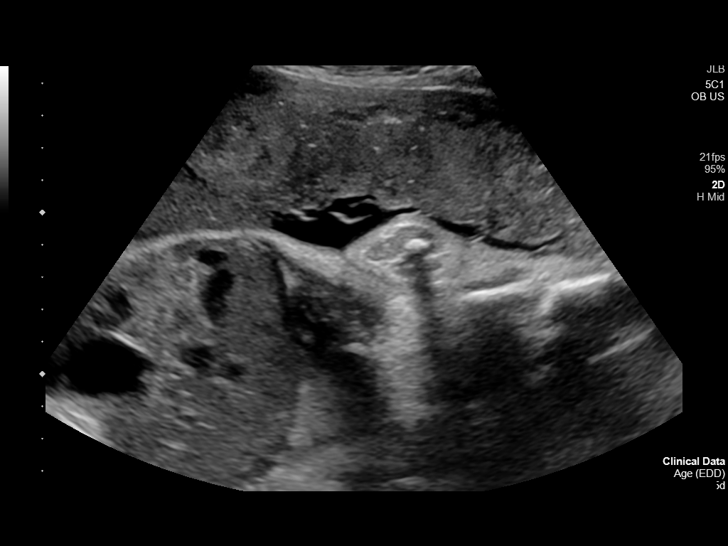
[im 9/26]
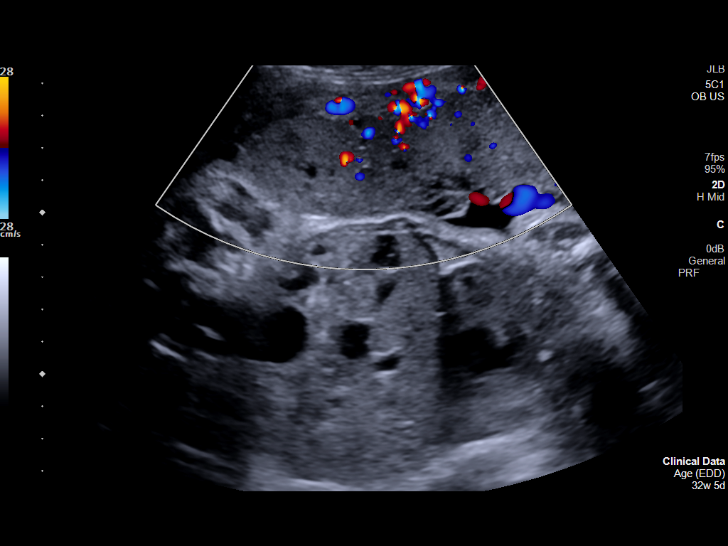
[im 11/26]
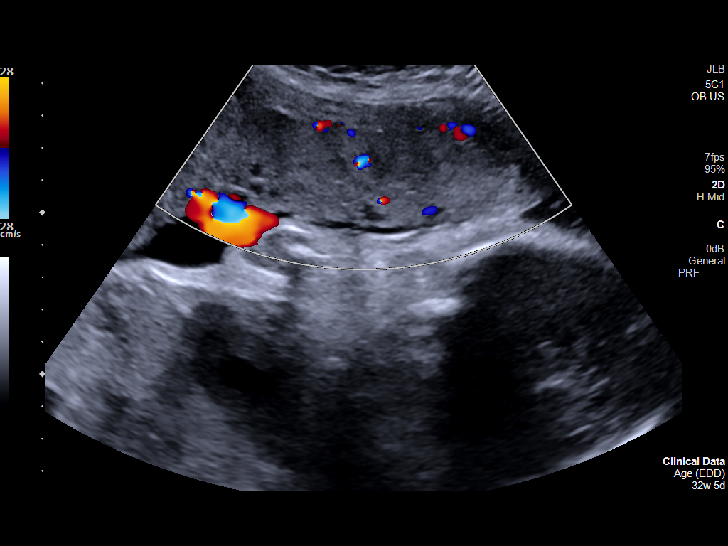
[im 13/26]
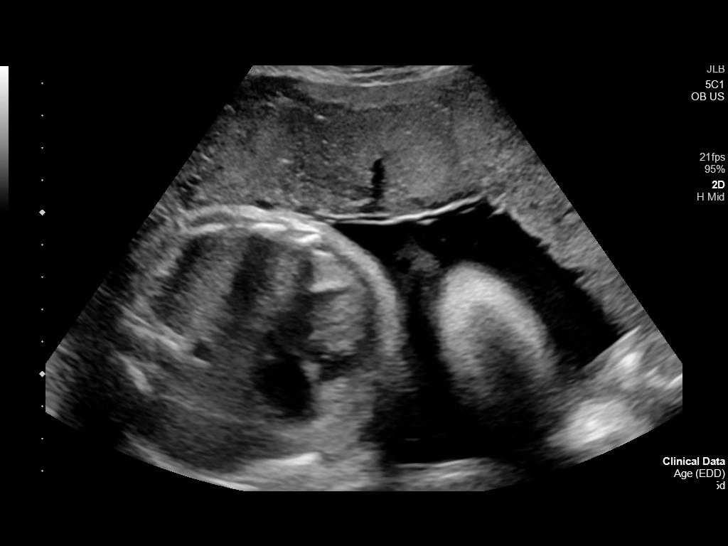
[im 14/26]
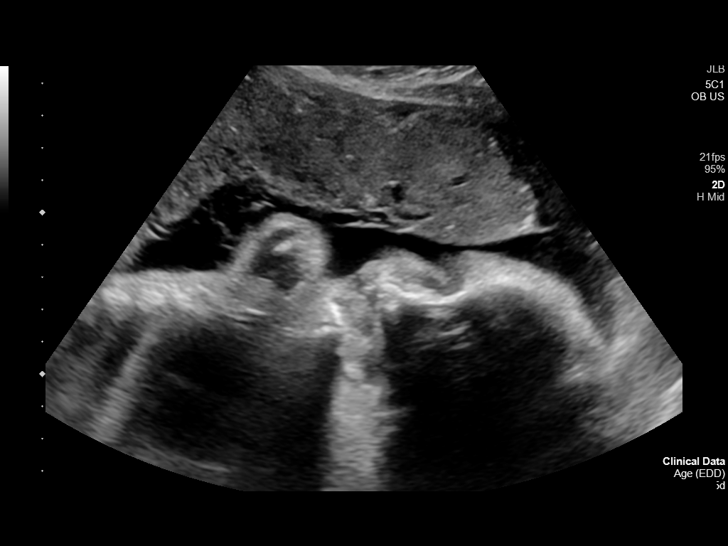
[im 16/26]
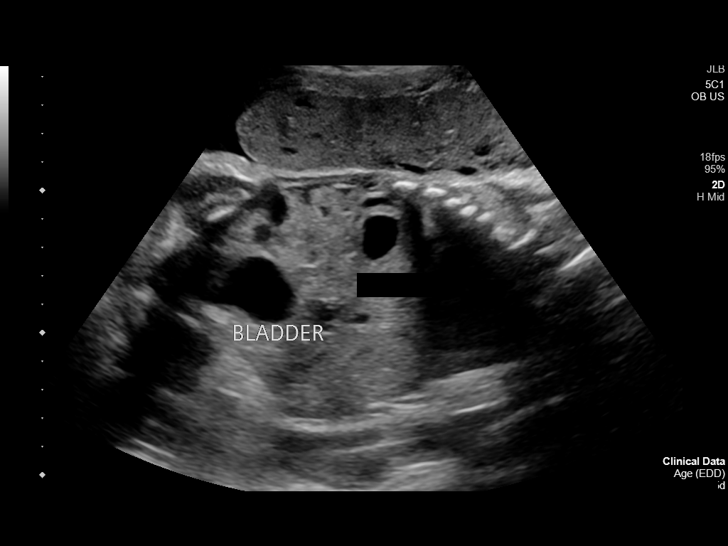
[im 18/26]
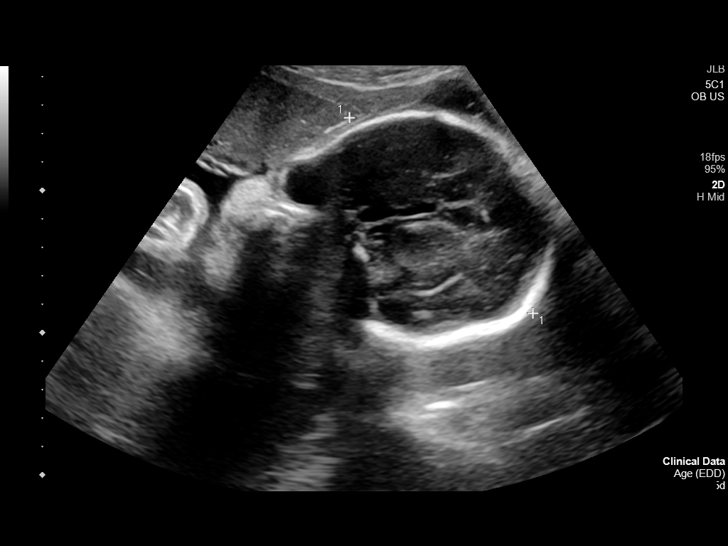
[im 20/26]
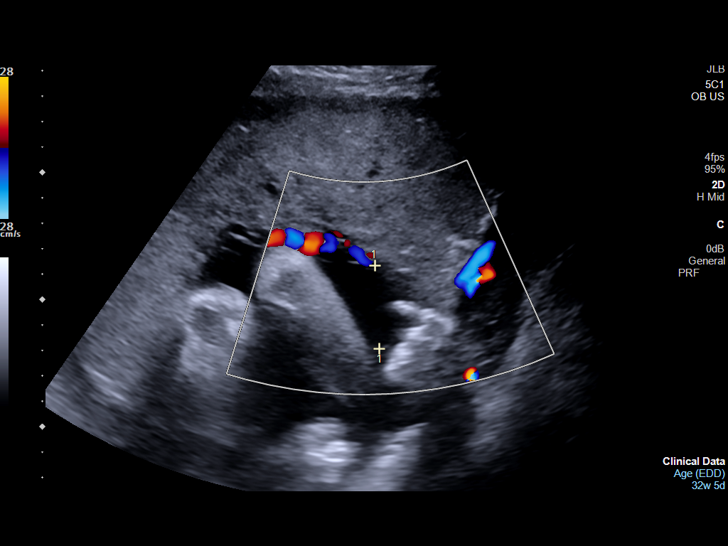
[im 22/26]
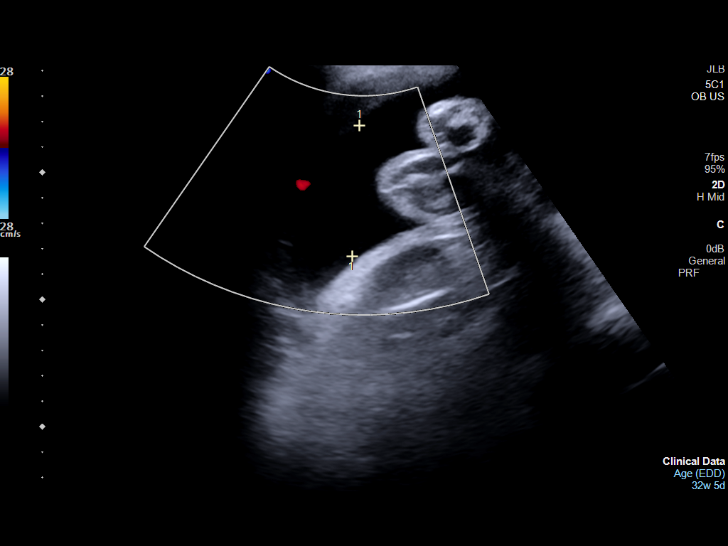
[im 24/26]
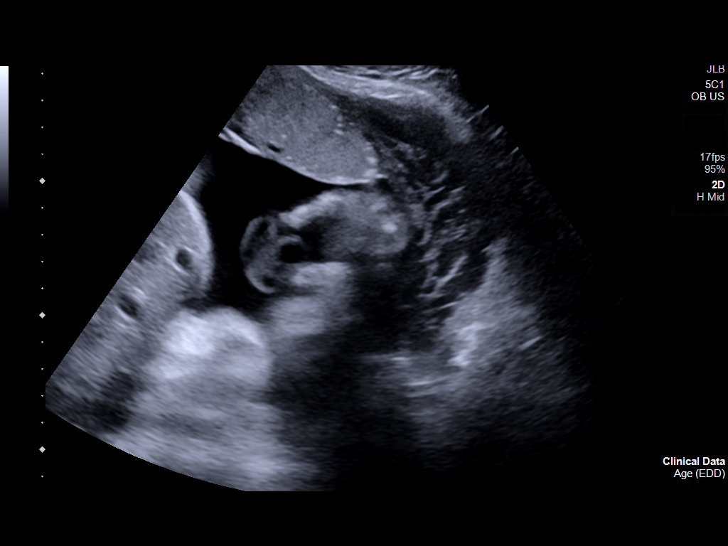
[im 26/26]
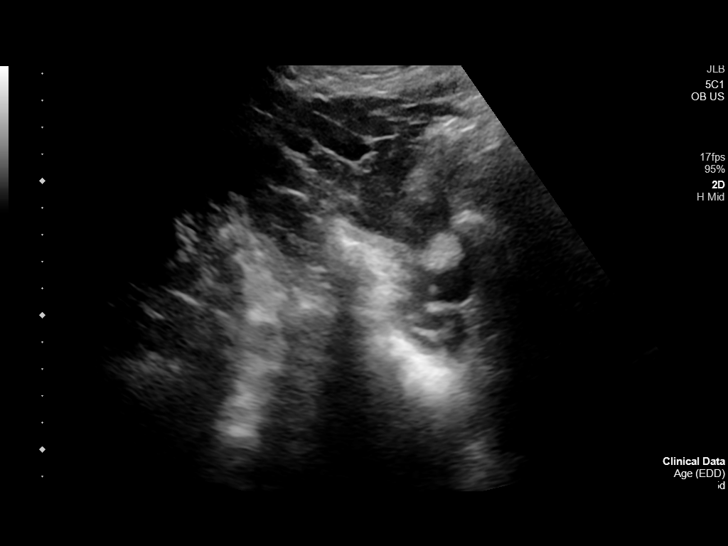

[14 of 26 positions shown; findings below may reference images not displayed]

FINDINGS: Number of Fetuses: 1

Heart Rate:  150 bpm

Movement: Detected

Presentation: Cephalic

Placental Location: Anterior

Previa: No

Amniotic Fluid (Subjective):  Within normal limits.

AFI: 16 cm

BPD: 8.2 cm 32 w  6 d

MATERNAL FINDINGS:

Cervix:  Appears closed.

Uterus/Adnexae: No abnormality visualized.
IMPRESSION: Single live intrauterine pregnancy with an estimated gestational age
of 32 weeks, 6 days based on BPD. No acute findings.

This exam is performed on an emergent basis and does not
comprehensively evaluate fetal size, dating, or anatomy; follow-up
complete OB US should be considered if further fetal assessment is
warranted.

## 2021-09-30 ENCOUNTER — Other Ambulatory Visit: Payer: Self-pay | Admitting: Family Medicine

## 2021-09-30 DIAGNOSIS — E039 Hypothyroidism, unspecified: Secondary | ICD-10-CM

## 2021-10-18 ENCOUNTER — Telehealth: Payer: Self-pay

## 2021-10-18 NOTE — Telephone Encounter (Signed)
Called patient to get insurance information for a lab order.

## 2021-11-10 ENCOUNTER — Other Ambulatory Visit: Payer: Self-pay | Admitting: Family Medicine

## 2021-11-10 DIAGNOSIS — E039 Hypothyroidism, unspecified: Secondary | ICD-10-CM
